# Patient Record
Sex: Female | Born: 1955 | Race: White | Hispanic: No | Marital: Single | Smoking: Current every day smoker
Health system: Southern US, Community
[De-identification: ages and names within clinical notes are randomized; demographics above are authoritative.]

## PROBLEM LIST (undated history)

## (undated) DIAGNOSIS — Z21 Asymptomatic human immunodeficiency virus [HIV] infection status: Secondary | ICD-10-CM

## (undated) DIAGNOSIS — B20 Human immunodeficiency virus [HIV] disease: Secondary | ICD-10-CM

## (undated) HISTORY — DX: Human immunodeficiency virus (HIV) disease: B20

---

## 1997-10-10 ENCOUNTER — Emergency Department (HOSPITAL_COMMUNITY): Admission: EM | Admit: 1997-10-10 | Discharge: 1997-10-10 | Payer: Self-pay | Admitting: Emergency Medicine

## 1998-06-06 ENCOUNTER — Emergency Department (HOSPITAL_COMMUNITY): Admission: EM | Admit: 1998-06-06 | Discharge: 1998-06-06 | Payer: Self-pay | Admitting: Emergency Medicine

## 1999-08-23 ENCOUNTER — Emergency Department (HOSPITAL_COMMUNITY): Admission: EM | Admit: 1999-08-23 | Discharge: 1999-08-23 | Payer: Self-pay | Admitting: Emergency Medicine

## 2003-09-30 ENCOUNTER — Emergency Department (HOSPITAL_COMMUNITY): Admission: EM | Admit: 2003-09-30 | Discharge: 2003-09-30 | Payer: Self-pay | Admitting: Emergency Medicine

## 2004-12-19 ENCOUNTER — Emergency Department (HOSPITAL_COMMUNITY): Admission: EM | Admit: 2004-12-19 | Discharge: 2004-12-19 | Payer: Self-pay | Admitting: Emergency Medicine

## 2005-04-23 IMAGING — CR DG WRIST COMPLETE 3+V*R*
1 series · 1 of 1 positions shown · non-contrast
Comparison: none

CLINICAL DATA: Recent fall with pain. 
 RIGHT WRIST (FOUR VIEWS):
 Four views of the right wrist were obtained.  
 No definite fracture is seen.  However, on the oblique view there is a linear lucency through the region of radial styloid extending into the radiocarpal joint space which could represent nondisplaced fracture.  There is some soft tissue swelling present.    The navicular appears intact.
 IMPRESSION
 Cannot exclude nondisplaced fracture of the right radial styloid seen only on one view.  Suggest follow-up films.

[view not recorded]
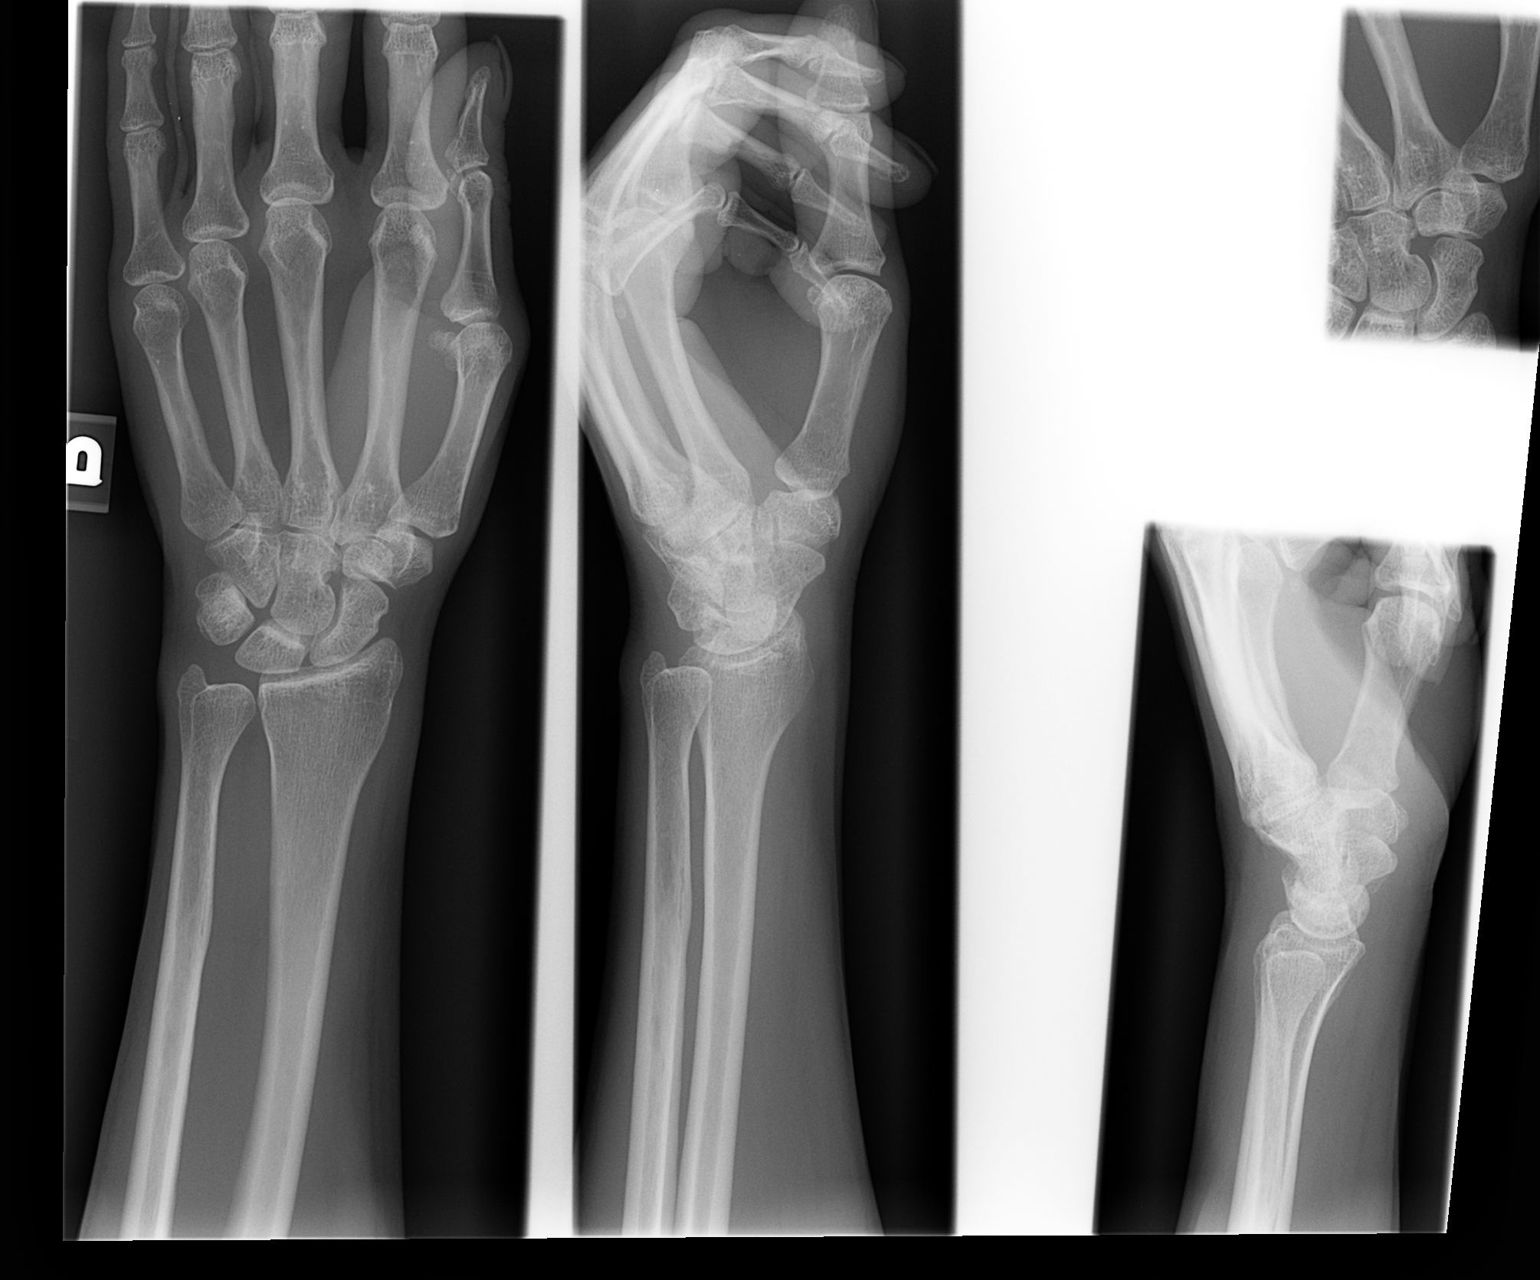

[1 of 1 positions shown; findings below may reference images not displayed]

## 2014-01-23 DIAGNOSIS — B20 Human immunodeficiency virus [HIV] disease: Secondary | ICD-10-CM | POA: Diagnosis present

## 2015-07-03 DIAGNOSIS — B0223 Postherpetic polyneuropathy: Secondary | ICD-10-CM | POA: Insufficient documentation

## 2019-12-14 ENCOUNTER — Encounter (HOSPITAL_COMMUNITY): Payer: Self-pay | Admitting: *Deleted

## 2019-12-14 ENCOUNTER — Other Ambulatory Visit: Payer: Self-pay

## 2019-12-14 ENCOUNTER — Emergency Department (HOSPITAL_COMMUNITY): Payer: Self-pay

## 2019-12-14 ENCOUNTER — Emergency Department (HOSPITAL_COMMUNITY)
Admission: EM | Admit: 2019-12-14 | Discharge: 2019-12-14 | Disposition: A | Discharge status: Home / Self Care | Payer: Self-pay | Attending: Emergency Medicine | Admitting: Emergency Medicine

## 2019-12-14 DIAGNOSIS — R531 Weakness: Secondary | ICD-10-CM | POA: Insufficient documentation

## 2019-12-14 DIAGNOSIS — R197 Diarrhea, unspecified: Secondary | ICD-10-CM | POA: Insufficient documentation

## 2019-12-14 DIAGNOSIS — R0602 Shortness of breath: Secondary | ICD-10-CM | POA: Insufficient documentation

## 2019-12-14 DIAGNOSIS — R0989 Other specified symptoms and signs involving the circulatory and respiratory systems: Secondary | ICD-10-CM | POA: Insufficient documentation

## 2019-12-14 DIAGNOSIS — R638 Other symptoms and signs concerning food and fluid intake: Secondary | ICD-10-CM | POA: Insufficient documentation

## 2019-12-14 DIAGNOSIS — F17200 Nicotine dependence, unspecified, uncomplicated: Secondary | ICD-10-CM | POA: Insufficient documentation

## 2019-12-14 HISTORY — DX: Asymptomatic human immunodeficiency virus (hiv) infection status: Z21

## 2019-12-14 LAB — CBC WITH DIFFERENTIAL/PLATELET
Abs Immature Granulocytes: 0.02 10*3/uL (ref 0.00–0.07)
Basophils Absolute: 0 10*3/uL (ref 0.0–0.1)
Basophils Relative: 0 %
Eosinophils Absolute: 0 10*3/uL (ref 0.0–0.5)
Eosinophils Relative: 0 %
HCT: 44.1 % (ref 36.0–46.0)
Hemoglobin: 14.7 g/dL (ref 12.0–15.0)
Immature Granulocytes: 0 %
Lymphocytes Relative: 29 %
Lymphs Abs: 1.3 10*3/uL (ref 0.7–4.0)
MCH: 30.8 pg (ref 26.0–34.0)
MCHC: 33.3 g/dL (ref 30.0–36.0)
MCV: 92.3 fL (ref 80.0–100.0)
Monocytes Absolute: 0.4 10*3/uL (ref 0.1–1.0)
Monocytes Relative: 8 %
Neutro Abs: 2.8 10*3/uL (ref 1.7–7.7)
Neutrophils Relative %: 63 %
Platelets: 153 10*3/uL (ref 150–400)
RBC: 4.78 MIL/uL (ref 3.87–5.11)
RDW: 13.4 % (ref 11.5–15.5)
WBC: 4.5 10*3/uL (ref 4.0–10.5)
nRBC: 0 % (ref 0.0–0.2)

## 2019-12-14 LAB — COMPREHENSIVE METABOLIC PANEL
ALT: 20 U/L (ref 0–44)
AST: 35 U/L (ref 15–41)
Albumin: 4.8 g/dL (ref 3.5–5.0)
Alkaline Phosphatase: 66 U/L (ref 38–126)
Anion gap: 12 (ref 5–15)
BUN: 7 mg/dL — ABNORMAL LOW (ref 8–23)
CO2: 26 mmol/L (ref 22–32)
Calcium: 9.6 mg/dL (ref 8.9–10.3)
Chloride: 100 mmol/L (ref 98–111)
Creatinine, Ser: 0.59 mg/dL (ref 0.44–1.00)
GFR calc Af Amer: >60 mL/min (ref >60)
GFR calc non Af Amer: >60 mL/min (ref >60)
Glucose, Bld: 106 mg/dL — ABNORMAL HIGH (ref 70–99)
Potassium: 3.7 mmol/L (ref 3.5–5.1)
Sodium: 138 mmol/L (ref 135–145)
Total Bilirubin: 0.9 mg/dL (ref 0.3–1.2)
Total Protein: 10.3 g/dL — ABNORMAL HIGH (ref 6.5–8.1)

## 2019-12-14 LAB — LIPASE, BLOOD: Lipase: 26 U/L (ref 11–51)

## 2019-12-14 MED ORDER — SODIUM CHLORIDE 0.9 % IV BOLUS
1000.0000 mL | Freq: Once | INTRAVENOUS | Status: AC
  Administered 2019-12-14: 1000 mL via INTRAVENOUS

## 2019-12-14 NOTE — ED Provider Notes (Addendum)
I saw and evaluated the patient, reviewed the resident's note and I agree with the findings and plan.  EKG: EKG Interpretation  Date/Time:  Wednesday December 14 2019 16:30:02 EDT Ventricular Rate:  65 PR Interval:    QRS Duration: 95 QT Interval:  419 QTC Calculation: 436 R Axis:   90 Text Interpretation: Sinus rhythm Borderline right axis deviation 12 Lead; Mason-Likar No old tracing to compare Confirmed by Lorre Nick ([id number]) on 12/14/2019 5:45:86 PM  64 year old female here with diarrhea after possible bad food exposure.  Has no abdominal pain no emesis.  Labs reassuring here will discharge   Lorre Nick, MD 12/14/19 1724    Lorre Nick, MD 12/14/19 1725

## 2019-12-14 NOTE — ED Provider Notes (Signed)
[facility]-EMERGENCY DEPT Provider Note   CSN: [id number] Arrival date & time: 12/14/19  1056     History Chief Complaint  Patient presents with  . Shortness of Breath  . Weakness    Renee Stark is a 64 y.o. female.  Patient is a 65 year old female with past medical history significant for well-controlled HIV presenting with 2 days of malaise, weakness, loose bowel movements after eating fast food on Monday evening.  Patient states Monday evening she had [employer] with her son and the next day noticed that she felt really weak, had a dry mouth, and had multiple soft nonwatery bowel movements.  Patient states she had about 4 bowel movements on Tuesday interim for today.  Patient denies nausea, vomiting, abdominal pain but does states she got short of breath today after having a bowel movement which lasted for a few minutes and then resolved.  Patient states she also had a second bout of shortness of breath when arriving in the emergency department which lasted 5 to 10 minutes.  She states that she thinks this may have been due to anxiety but she has never had this happen before.  Patient states that her son who ate fast food with her, though he had a different meal did not get ill and has no sick contacts.  She denies ever having history of blood clot, denies other medical conditions other than her HIV, and states she does not take any other medications besides those for her HIV.        Past Medical History:  Diagnosis Date  . HIV (human immunodeficiency virus infection) (HCC)     There are no problems to display for this patient.   History reviewed. No pertinent surgical history.   OB History   No obstetric history on file.     No family history on file.  Social History   Tobacco Use  . Smoking status: Current Every Day Smoker  . Smokeless tobacco: Never Used  Substance Use Topics  . Alcohol use: Not Currently  . Drug use: Not Currently     Home Medications Prior to Admission medications   Not on File    Allergies    Patient has no allergy information on record.  Review of Systems   Review of Systems  Constitutional: Positive for appetite change (reduced). Negative for chills and fever.  HENT: Negative for congestion.   Eyes: Negative for visual disturbance.  Respiratory: Positive for shortness of breath. Negative for chest tightness and wheezing.   Cardiovascular: Negative for chest pain and leg swelling.  Gastrointestinal: Positive for diarrhea. Negative for abdominal pain, blood in stool, nausea and vomiting.  Genitourinary: Negative for dysuria.  Skin: Negative for rash.  Neurological: Positive for weakness (generalized). Negative for numbness and headaches.    Physical Exam Updated Vital Signs BP 135/66   Pulse 61   Temp 98.2 F (36.8 C) (Oral)   Resp 19   Ht 5\' 3"  (1.6 m)   Wt 50.8 kg   SpO2 99%   BMI 19.84 kg/m   Physical Exam Vitals reviewed.  Constitutional:      General: She is not in acute distress.    Appearance: She is well-developed.  HENT:     Head: Normocephalic and atraumatic.     Mouth/Throat:     Pharynx: No pharyngeal swelling or oropharyngeal exudate.     Comments: dry Eyes:     Extraocular Movements: Extraocular movements intact.  Cardiovascular:  Rate and Rhythm: Normal rate and regular rhythm.     Heart sounds: No murmur heard.   Pulmonary:     Effort: Pulmonary effort is normal.     Breath sounds: Examination of the left-lower field reveals rales. Rales (faint) present. No wheezing or rhonchi.  Abdominal:     General: Bowel sounds are normal.     Palpations: Abdomen is soft. There is no mass.     Tenderness: There is no abdominal tenderness. There is no guarding.  Musculoskeletal:     Cervical back: Normal range of motion.     Right lower leg: No tenderness. No edema.     Left lower leg: No tenderness. No edema.  Skin:    General: Skin is warm and dry.   Neurological:     General: No focal deficit present.     Mental Status: She is alert.  Psychiatric:        Mood and Affect: Mood normal.     ED Results / Procedures / Treatments   Labs (all labs ordered are listed, but only abnormal results are displayed) Labs Reviewed  COMPREHENSIVE METABOLIC PANEL - Abnormal; Notable for the following components:      Result Value   Glucose, Bld 106 (*)    BUN 7 (*)    Total Protein 10.3 (*)    All other components within normal limits  CBC WITH DIFFERENTIAL/PLATELET  LIPASE, BLOOD    EKG EKG Interpretation  Date/Time:  Wednesday December 14 2019 16:30:02 EDT Ventricular Rate:  65 PR Interval:    QRS Duration: 95 QT Interval:  419 QTC Calculation: 436 R Axis:   90 Text Interpretation: Sinus rhythm Borderline right axis deviation 12 Lead; Mason-Likar No old tracing to compare Confirmed by Lorre Nick ([id number]) on 12/14/2019 5:24:42 PM   Radiology DG Chest 2 View  Result Date: 12/14/2019 CLINICAL DATA:  Shortness of breath EXAM: CHEST - 2 VIEW COMPARISON:  None. FINDINGS: The heart size and mediastinal contours are within normal limits. Aortic knob calcifications are seen. There appears to be a probable diaphragmatic hernia with loops bowel and likely the gastric bubble noted overlying the mediastinum. Subsegmental atelectasis likely seen at the left lung base. The right lung is clear. No acute osseous abnormality. IMPRESSION: No active cardiopulmonary disease. 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 Electronically Signed   By: Jonna Clark M.D.   On: 12/14/2019 17:28    Procedures Procedures (including critical care time)  Medications Ordered in ED Medications  sodium chloride 0.9 % bolus 1,000 mL (1,000 mLs Intravenous New Bag/Given 12/14/19 1646)    ED Course  I have reviewed the triage vital signs and the nursing notes.  Pertinent labs & imaging results that were available  during my care of the patient were reviewed by me and considered in my medical decision making (see chart for details).    MDM Rules/Calculators/A&P                          64 year old female with a history of well-controlled HIV presenting with malaise, loose stools, and 2 bouts of shortness of breath after consuming fast food 2 days ago.  Will check CBC, CMP, lipase, chest x-ray.  We will also provide normal saline bolus. CBC shows normal white blood cell count of 4.5, no anemia. CMP wnl other than increased protein level of 10.3. Chest x-ray with no active cardiopulmonary disease. EKG shows  sinus rhythm.  Patient states she feels better after receiving IV fluids.  Most likely cause of patient's diarrhea is from the food she consumed.  Patient with normal lipase and AST/ALT with no hyper/hypoglycemia.  CBC shows white blood cell count within normal limits making serious infection less likely, patient also has no symptoms of fevers, vomiting, or other concerning symptoms which supports her diarrhea being primarily from the fast food that she consumed.  We will plan for discharge with return precautions provided.  Final Clinical Impression(s) / ED Diagnoses Final diagnoses:  Diarrhea, unspecified type    Rx / DC Orders ED Discharge Orders    None       Jackelyn Poling, DO 12/14/19 1833    Lorre Nick, MD 12/15/19 1126

## 2019-12-14 NOTE — Discharge Instructions (Addendum)
You were evaluated in the emergency department due to two days of diarrhea after consuming some fast food and feeling ill.  In the emergency department we checked some blood work as well as a chest x-ray and an EKG.  None of these showed any concerning cause of your diarrhea.  We also provided you with some IV fluids which seemed to make you feel better.  It is important that you continue to drink plenty of fluids as your diarrhea may continue for another day or 2.  If you develop worsening diarrhea, fevers, shortness of breath, weakness, or other concerning symptoms please return to the emergency department or reach out to your primary care physician for further advisement.

## 2019-12-14 NOTE — ED Triage Notes (Signed)
Pt complains of weakness, difficulty breathing since yesterday. She also reports more frequent bowel movements, no diarrhea. She states she went to [employer] and has not felt well since.

## 2021-06-07 ENCOUNTER — Emergency Department (HOSPITAL_COMMUNITY)
Admission: EM | Admit: 2021-06-07 | Discharge: 2021-06-07 | Disposition: A | Discharge status: Home / Self Care | Payer: Self-pay | Attending: Emergency Medicine | Admitting: Emergency Medicine

## 2021-06-07 ENCOUNTER — Encounter (HOSPITAL_COMMUNITY): Payer: Self-pay

## 2021-06-07 ENCOUNTER — Emergency Department (HOSPITAL_COMMUNITY): Payer: Self-pay

## 2021-06-07 ENCOUNTER — Other Ambulatory Visit: Payer: Self-pay

## 2021-06-07 DIAGNOSIS — Z20822 Contact with and (suspected) exposure to covid-19: Secondary | ICD-10-CM | POA: Insufficient documentation

## 2021-06-07 DIAGNOSIS — R051 Acute cough: Secondary | ICD-10-CM | POA: Insufficient documentation

## 2021-06-07 DIAGNOSIS — Z21 Asymptomatic human immunodeficiency virus [HIV] infection status: Secondary | ICD-10-CM | POA: Insufficient documentation

## 2021-06-07 LAB — RESP PANEL BY RT-PCR (FLU A&B, COVID) ARPGX2
Influenza A by PCR: NEGATIVE
Influenza B by PCR: NEGATIVE
SARS Coronavirus 2 by RT PCR: NEGATIVE

## 2021-06-07 NOTE — ED Provider Triage Note (Signed)
Emergency Medicine Provider Triage Evaluation Note  Renee Stark , a 66 y.o. female  was evaluated in triage.  Pt complains of episode of SOB.  States has had a cough x3 days, worsening.  States tonight she coughed so hard she got SOB temporarily.  States she feels fine now but was concerned so son brought her in.  No hx of asthma, COPD, or other respiratory issues.  Denies chest pain.  Review of Systems  Positive: Cough, SOB (resolved now) Negative: fever  Physical Exam  BP (!) 165/91 (BP Location: Left Arm)    Pulse 90    Temp (!) 97.5 F (36.4 C) (Oral)    Resp 18    Ht 5\' 3"  (1.6 m)    Wt 47.6 kg    SpO2 100%    BMI 18.60 kg/m  Gen:   Awake, no distress   Resp:  Normal effort, speaking in full sentences without difficulty MSK:   Moves extremities without difficulty  Other:    Medical Decision Making  Medically screening exam initiated at 12:42 AM.  Appropriate orders placed.  Renee Stark was informed that the remainder of the evaluation will be completed by another provider, this initial triage assessment does not replace that evaluation, and the importance of remaining in the ED until their evaluation is complete.  Cough w/episode of SOB.  Back to baseline now, VSS.  Denies chest pain.  Will obtain CXR, covid/flu screen.   Alto Denver, PA-C 06/07/21 [phone]

## 2021-06-07 NOTE — ED Provider Notes (Signed)
[facility]-EMERGENCY DEPT Provider Note   CSN: [id number] Arrival date & time: 06/07/21  0017     History  Chief Complaint  Patient presents with   Cough    Renee Stark is a 66 y.o. female.  HPI    66 year old female with history of cough.  Cough into 3 days.  She is producing some phlegm, but unsure what color.  She denies any associated fevers, chills.  Yesterday night the cough was severe enough where she started feeling short of breath, and decided to come to the ER.  She denies any body aches, nausea, vomiting.   Records indicate that patient also has history of HIV.  She reports that it is well controlled.  Patient denies any primary cardiac, pulmonary disease history.  At the moment, she has no shortness of breath, the episode of shortness of breath lasted few minutes after her coughing spell.  Home Medications Prior to Admission medications   Not on File      Allergies    Patient has no known allergies.    Review of Systems   Review of Systems  Constitutional:  Positive for activity change.  Respiratory:  Positive for cough and shortness of breath.   Cardiovascular:  Negative for chest pain.  Gastrointestinal:  Negative for nausea and vomiting.  Allergic/Immunologic: Positive for immunocompromised state.   Physical Exam Updated Vital Signs BP (!) 162/93 (BP Location: Left Arm)    Pulse 89    Temp (!) 97.3 F (36.3 C) (Oral)    Resp 18    Ht 5\' 3"  (1.6 m)    Wt 47.6 kg    SpO2 95%    BMI 18.60 kg/m  Physical Exam Vitals and nursing note reviewed.  Constitutional:      Appearance: She is well-developed.  HENT:     Head: Atraumatic.  Cardiovascular:     Rate and Rhythm: Normal rate.  Pulmonary:     Effort: Pulmonary effort is normal.     Breath sounds: No wheezing.  Musculoskeletal:     Cervical back: Normal range of motion and neck supple.  Skin:    General: Skin is warm and dry.  Neurological:     Mental Status: She is alert  and oriented to person, place, and time.    ED Results / Procedures / Treatments   Labs (all labs ordered are listed, but only abnormal results are displayed) Labs Reviewed  RESP PANEL BY RT-PCR (FLU A&B, COVID) [id number]    EKG None  Radiology DG Chest 2 View  Result Date: 06/07/2021 CLINICAL DATA:  Cough and shortness of breath. EXAM: CHEST - 2 VIEW COMPARISON:  Radiograph 12/14/2019 FINDINGS: Large hiatal/diaphragmatic hernia likely containing a small and/or large bowel as well as stomach. This obscures assessment of the heart size which appears normal on the lateral view. Adjacent atelectasis or scarring at the left lung base. No acute consolidation. No pleural effusion, pneumothorax, or pulmonary edema. No acute osseous abnormalities are seen. IMPRESSION: 1. No acute findings. 2. 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 Adjacent atelectasis or scarring at the left lung base. Findings are unchanged from prior exam. Electronically Signed   By: 12/16/2019 M.D.   On: 06/07/2021 00:58    Procedures Procedures    Medications Ordered in ED Medications - No data to display  ED Course/ Medical Decision Making/ A&P  Medical Decision Making  66 year old woman comes in with chief complaint of cough.  She had a coughing spell that led to shortness of breath, and prompted her to come to the ER this morning.  Coughing for 2 or 3 days, productive, but with clear lung exams and normal x-ray.  She also denies any fevers, chills.  History of HIV, but it is well controlled.  I have reviewed records from [facility] from 2021 that indicated the same.  At this time, advised patient to treat this like a viral syndrome. If her symptoms continue then she will need to see her PCP.  If her symptoms get worse, then she will have to come to the ER.  Specifically if she starts having fevers, shortness of breath she will  return to the ER  COVID-19, flu swab is negative. Chest x-ray was independently reviewed by me and does not show any acute findings.        Final Clinical Impression(s) / ED Diagnoses Final diagnoses:  Acute cough    Rx / DC Orders ED Discharge Orders     None         Derwood Kaplan, MD 06/07/21 1101

## 2021-06-07 NOTE — ED Triage Notes (Signed)
Pt reports productive cough x 3 days.  

## 2021-06-07 NOTE — Discharge Instructions (Signed)
Take OTC cough meds, hard candy, honey and hot tea for symptoms relief.

## 2021-07-07 IMAGING — CR DG CHEST 2V
2 series · 2 of 2 positions shown · non-contrast
Comparison: None.

CLINICAL DATA: Shortness of breath

EXAM:
CHEST - 2 VIEW

[w chest pa]
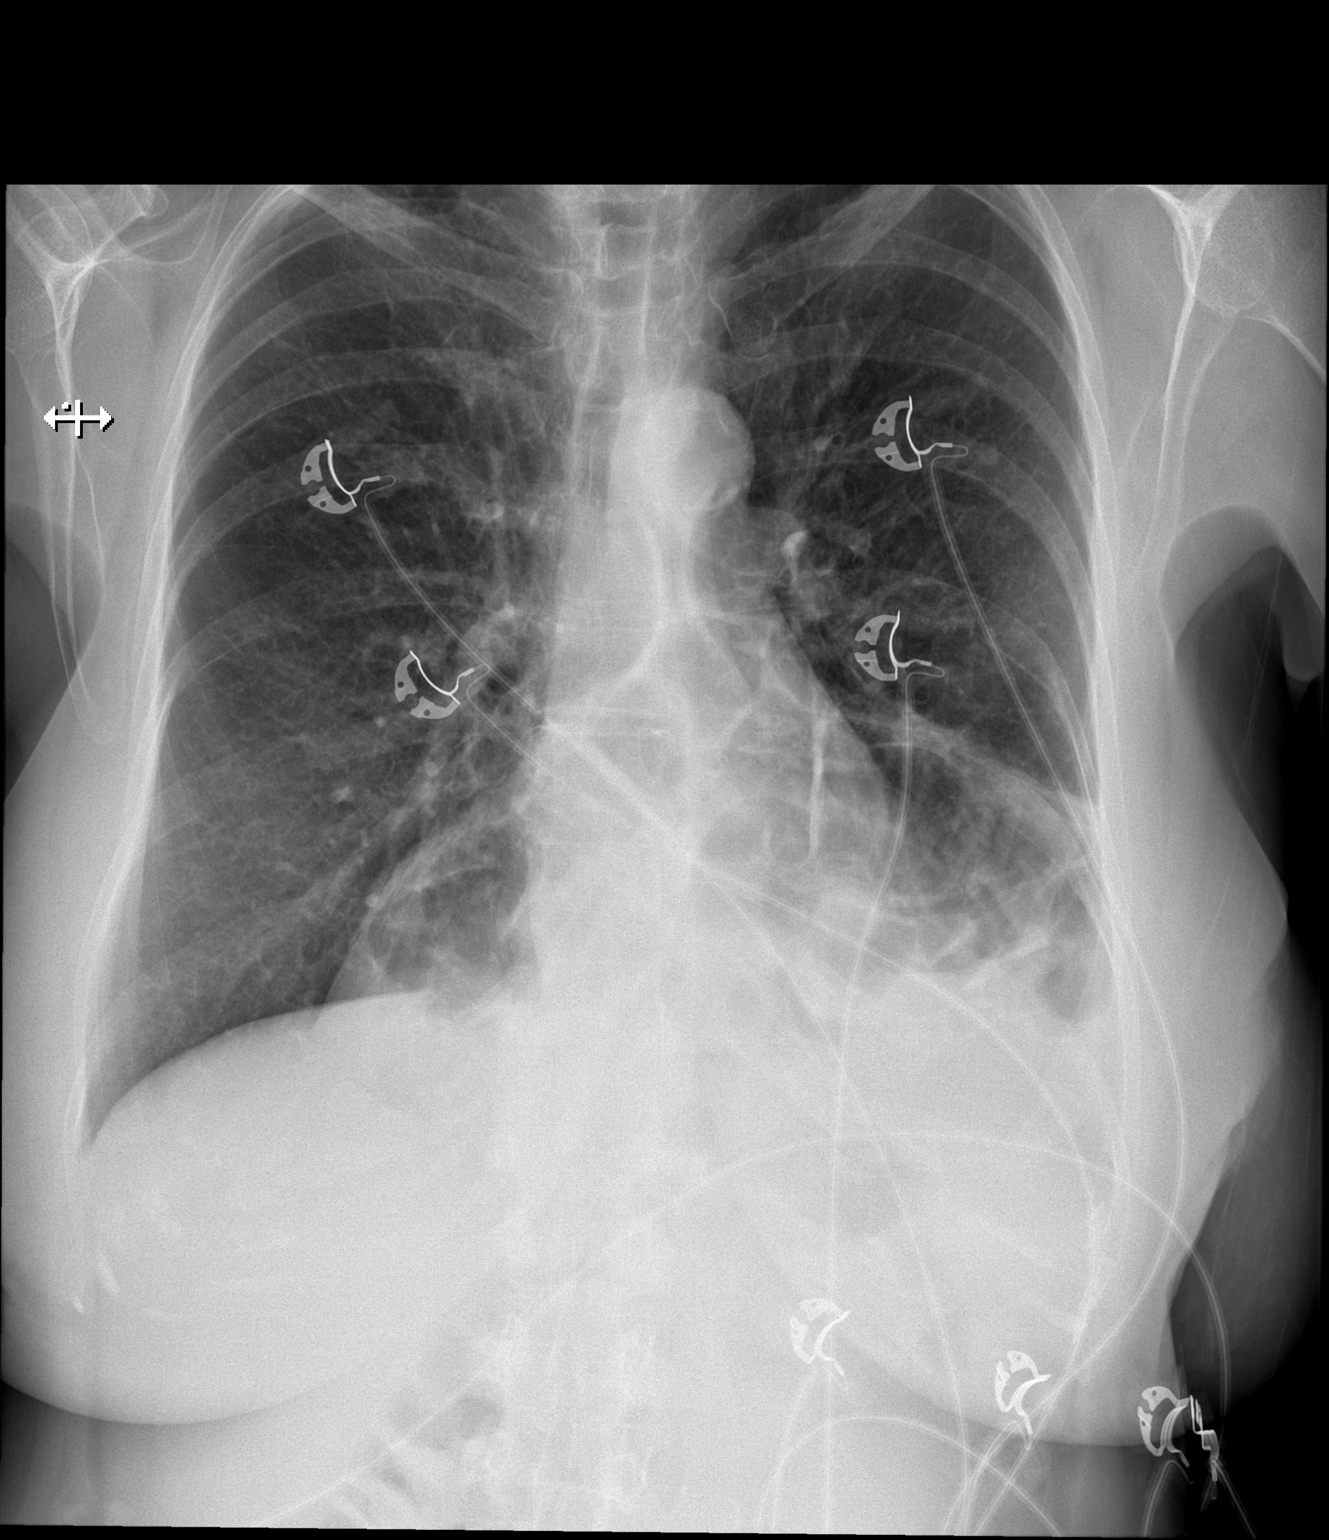

[w chest lat]
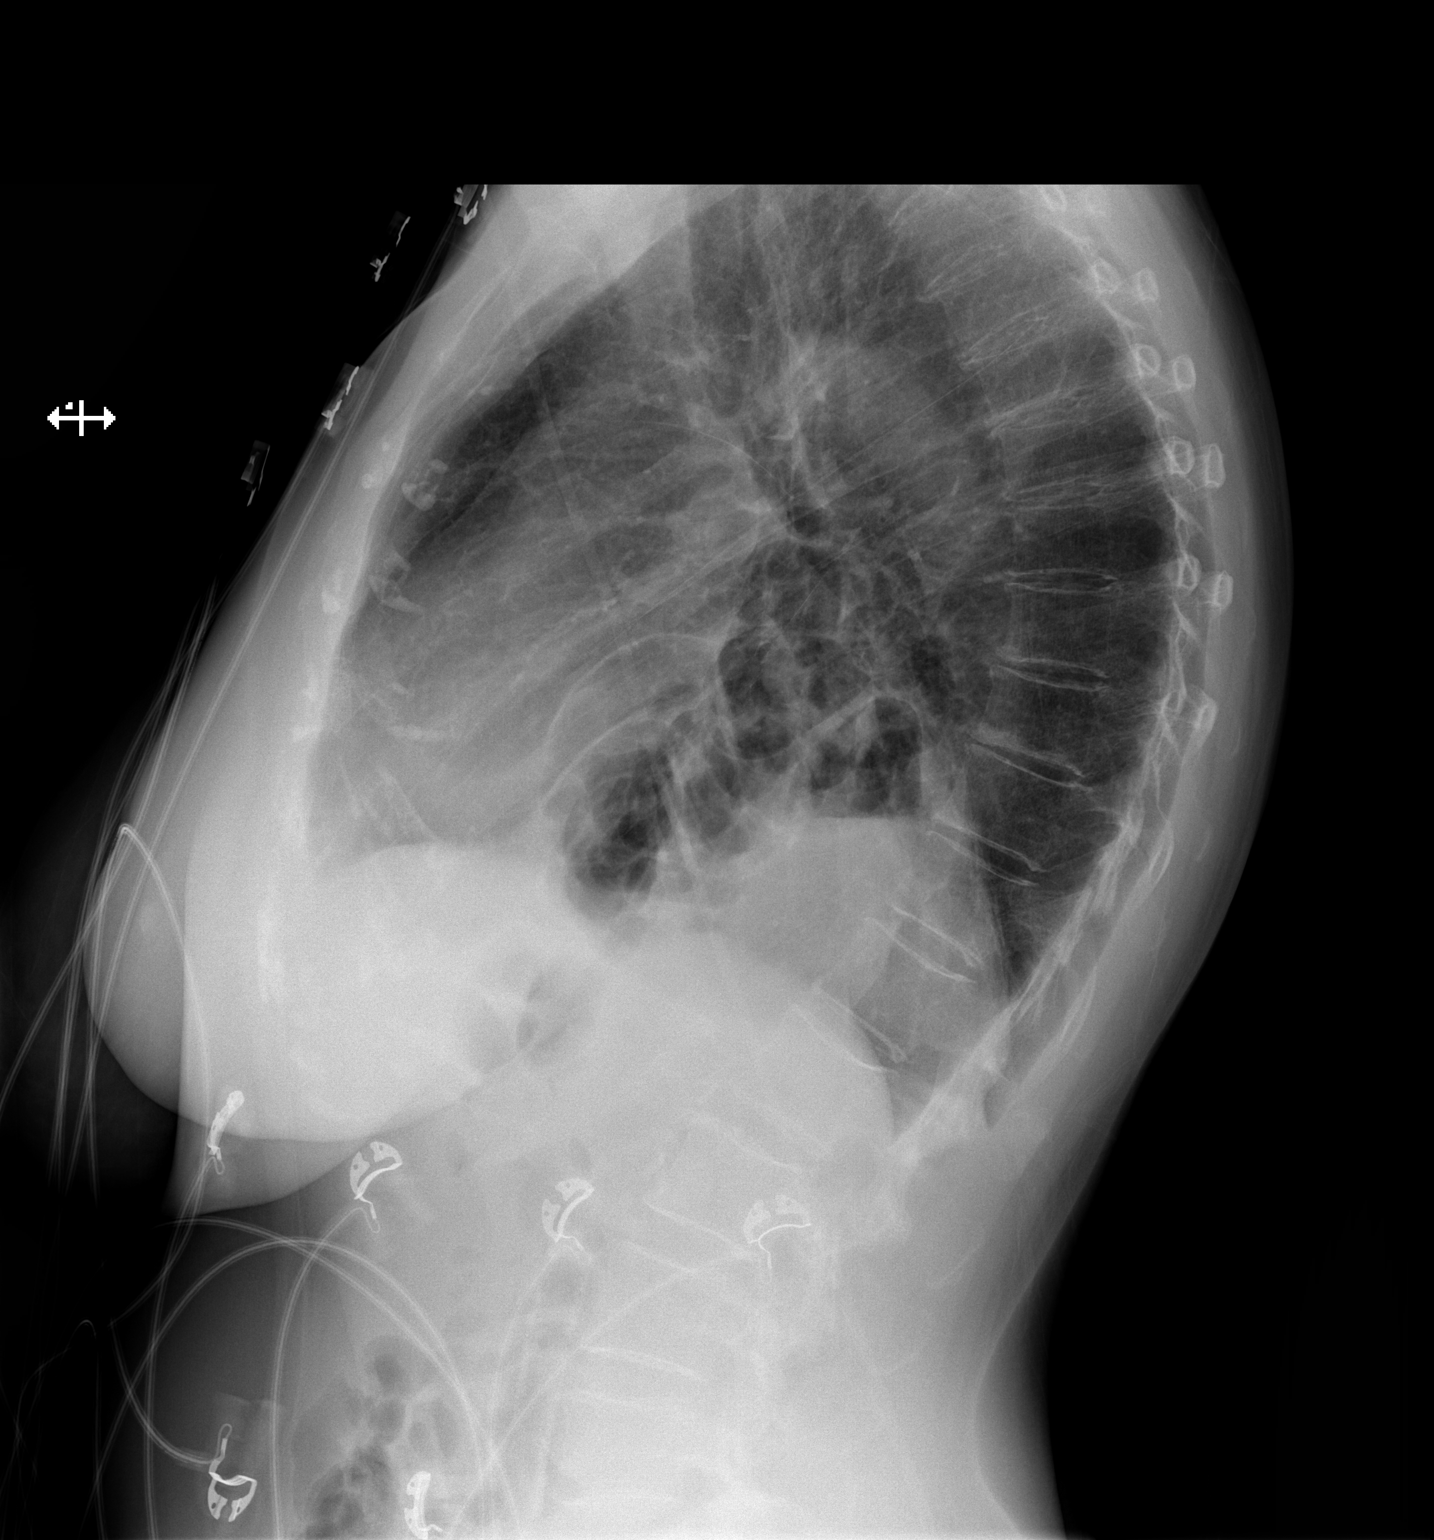

[2 of 2 positions shown; findings below may reference images not displayed]

FINDINGS: The heart size and mediastinal contours are within normal limits.
Aortic knob calcifications are seen. There appears to be a probable
diaphragmatic hernia with loops bowel and likely the gastric bubble
noted overlying the mediastinum. Subsegmental atelectasis likely
seen at the left lung base. The right lung is clear. No acute
osseous abnormality.
IMPRESSION: No active cardiopulmonary disease. Probable diaphragmatic hernia
with loops of bowel and stomach seen overlying the mediastinum.

## 2023-09-08 ENCOUNTER — Encounter (HOSPITAL_COMMUNITY): Payer: Self-pay | Admitting: Emergency Medicine

## 2023-09-08 ENCOUNTER — Emergency Department (HOSPITAL_COMMUNITY): Payer: Self-pay

## 2023-09-08 ENCOUNTER — Emergency Department (HOSPITAL_COMMUNITY)
Admission: EM | Admit: 2023-09-08 | Discharge: 2023-09-08 | Disposition: A | Discharge status: Home / Self Care | Payer: Self-pay | Attending: Emergency Medicine | Admitting: Emergency Medicine

## 2023-09-08 DIAGNOSIS — E876 Hypokalemia: Secondary | ICD-10-CM | POA: Insufficient documentation

## 2023-09-08 DIAGNOSIS — J189 Pneumonia, unspecified organism: Secondary | ICD-10-CM

## 2023-09-08 DIAGNOSIS — B370 Candidal stomatitis: Secondary | ICD-10-CM | POA: Insufficient documentation

## 2023-09-08 DIAGNOSIS — R748 Abnormal levels of other serum enzymes: Secondary | ICD-10-CM | POA: Insufficient documentation

## 2023-09-08 DIAGNOSIS — J181 Lobar pneumonia, unspecified organism: Secondary | ICD-10-CM | POA: Insufficient documentation

## 2023-09-08 DIAGNOSIS — Z21 Asymptomatic human immunodeficiency virus [HIV] infection status: Secondary | ICD-10-CM | POA: Insufficient documentation

## 2023-09-08 LAB — CBC WITH DIFFERENTIAL/PLATELET
Abs Immature Granulocytes: 1.06 10*3/uL — ABNORMAL HIGH (ref 0.00–0.07)
Basophils Absolute: 0.1 10*3/uL (ref 0.0–0.1)
Basophils Relative: 1 %
Eosinophils Absolute: 0 10*3/uL (ref 0.0–0.5)
Eosinophils Relative: 0 %
HCT: 35.7 % — ABNORMAL LOW (ref 36.0–46.0)
Hemoglobin: 11.5 g/dL — ABNORMAL LOW (ref 12.0–15.0)
Immature Granulocytes: 15 %
Lymphocytes Relative: 12 %
Lymphs Abs: 0.9 10*3/uL (ref 0.7–4.0)
MCH: 28 pg (ref 26.0–34.0)
MCHC: 32.2 g/dL (ref 30.0–36.0)
MCV: 86.9 fL (ref 80.0–100.0)
Monocytes Absolute: 0.5 10*3/uL (ref 0.1–1.0)
Monocytes Relative: 7 %
Neutro Abs: 4.4 10*3/uL (ref 1.7–7.7)
Neutrophils Relative %: 65 %
Platelets: 237 10*3/uL (ref 150–400)
RBC: 4.11 MIL/uL (ref 3.87–5.11)
RDW: 14.2 % (ref 11.5–15.5)
Smear Review: NORMAL
WBC: 6.9 10*3/uL (ref 4.0–10.5)
nRBC: 0 % (ref 0.0–0.2)

## 2023-09-08 LAB — COMPREHENSIVE METABOLIC PANEL WITH GFR
ALT: 14 U/L (ref 0–44)
AST: 33 U/L (ref 15–41)
Albumin: 2.8 g/dL — ABNORMAL LOW (ref 3.5–5.0)
Alkaline Phosphatase: 837 U/L — ABNORMAL HIGH (ref 38–126)
Anion gap: 12 (ref 5–15)
BUN: 12 mg/dL (ref 8–23)
CO2: 23 mmol/L (ref 22–32)
Calcium: 8.7 mg/dL — ABNORMAL LOW (ref 8.9–10.3)
Chloride: 92 mmol/L — ABNORMAL LOW (ref 98–111)
Creatinine, Ser: 0.5 mg/dL (ref 0.44–1.00)
GFR, Estimated: >60 mL/min (ref >60)
Glucose, Bld: 137 mg/dL — ABNORMAL HIGH (ref 70–99)
Potassium: 3 mmol/L — ABNORMAL LOW (ref 3.5–5.1)
Sodium: 127 mmol/L — ABNORMAL LOW (ref 135–145)
Total Bilirubin: 0.7 mg/dL (ref 0.0–1.2)
Total Protein: 8.3 g/dL — ABNORMAL HIGH (ref 6.5–8.1)

## 2023-09-08 LAB — MAGNESIUM: Magnesium: 2.1 mg/dL (ref 1.7–2.4)

## 2023-09-08 MED ORDER — AMOXICILLIN-POT CLAVULANATE 875-125 MG PO TABS: 1.0000 | ORAL_TABLET | Freq: Two times a day (BID) | ORAL | 0 refills | Status: DC

## 2023-09-08 MED ORDER — ONDANSETRON HCL 4 MG/2ML IJ SOLN
4.0000 mg | Freq: Once | INTRAMUSCULAR | Status: DC
  Filled 2023-09-08: qty 2

## 2023-09-08 MED ORDER — FLUCONAZOLE 200 MG PO TABS: 200.0000 mg | ORAL_TABLET | Freq: Every day | ORAL | 0 refills | Status: AC

## 2023-09-08 MED ORDER — SODIUM CHLORIDE 0.9 % IV SOLN
1.0000 g | Freq: Once | INTRAVENOUS | Status: AC
  Administered 2023-09-08: 1 g via INTRAVENOUS
  Filled 2023-09-08: qty 10

## 2023-09-08 MED ORDER — AZITHROMYCIN 250 MG PO TABS: 250.0000 mg | ORAL_TABLET | Freq: Every day | ORAL | 0 refills | Status: DC

## 2023-09-08 MED ORDER — FLUCONAZOLE 200 MG PO TABS
200.0000 mg | ORAL_TABLET | Freq: Once | ORAL | Status: AC
  Administered 2023-09-08: 200 mg via ORAL
  Filled 2023-09-08: qty 1

## 2023-09-08 MED ORDER — POTASSIUM CHLORIDE CRYS ER 20 MEQ PO TBCR
40.0000 meq | EXTENDED_RELEASE_TABLET | Freq: Once | ORAL | Status: AC
Start: 2023-09-08 — End: 2023-09-08
  Administered 2023-09-08: 40 meq via ORAL
  Filled 2023-09-08: qty 2

## 2023-09-08 MED ORDER — SULFAMETHOXAZOLE-TRIMETHOPRIM 800-160 MG PO TABS
1.0000 | ORAL_TABLET | Freq: Once | ORAL | Status: AC
  Administered 2023-09-08: 1 via ORAL
  Filled 2023-09-08: qty 1

## 2023-09-08 MED ORDER — AZITHROMYCIN 250 MG PO TABS
500.0000 mg | ORAL_TABLET | Freq: Once | ORAL | Status: AC
  Administered 2023-09-08: 500 mg via ORAL
  Filled 2023-09-08: qty 2

## 2023-09-08 MED ORDER — LIDOCAINE VISCOUS HCL 2 % MT SOLN
15.0000 mL | Freq: Once | OROMUCOSAL | Status: AC
  Administered 2023-09-08: 15 mL via OROMUCOSAL
  Filled 2023-09-08: qty 15

## 2023-09-08 NOTE — ED Provider Triage Note (Signed)
 Emergency Medicine Provider Triage Evaluation Note  Renee Stark , a 68 y.o. female  was evaluated in triage.  CC tongue pain. Pt complains of painful swallowing, white mouth and tongue and "can't eat". 2-3 weeks. Has been losing weight. Has not seen a Doctor in 2-3 years and thus has not had HIV medications for 2-3 years. Has been feeling "sick for a month." Thinks she has thrush.   Review of Systems  Positive: Cough Negative: HA, chest pain, shortness of breath, NVD  Physical Exam  BP 119/65 (BP Location: Left Arm)   Pulse 92   Temp 98.4 F (36.9 C) (Oral)   Resp 16   Ht 5\' 3"  (1.6 m)   Wt 39.2 kg   SpO2 99%   BMI 15.31 kg/m  Gen:   Awake, no distress   Resp:  Normal effort  MSK:   Moves extremities without difficulty  Other:    Medical Decision Making  Medically screening exam initiated at 3:26 PM.  Appropriate orders placed.  Renee Stark was informed that the remainder of the evaluation will be completed by another provider, this initial triage assessment does not replace that evaluation, and the importance of remaining in the ED until their evaluation is complete.     Smitty Knudsen, PA-C 09/08/23 1529

## 2023-09-08 NOTE — Discharge Instructions (Addendum)
 Please call Dr Elinor Parkinson with infectious disease for follow up.  Take fluconazole for thrush. Take Augmentin and azithromycin for pneumonia. If you have any new or worsening symptoms please return to emergency room.

## 2023-09-08 NOTE — ED Notes (Signed)
 Pt ambulated hallway with this RN while On pulse oximetry. O2 sats stayed between 99-100% and HR between 105-110. Pt denied SOB.

## 2023-09-08 NOTE — ED Provider Notes (Signed)
 [facility] EMERGENCY DEPARTMENT AT [facility] Provider Note   CSN: [id number] Arrival date & time: 09/08/23  1236     History  Chief Complaint  Patient presents with   Oral Pain    Renee Stark is a 68 y.o. female with past medical history of HIV presenting to emergency room with oral pain.  Patient reports symptoms been ongoing for about 2 weeks and associated with difficulty eating and painful swallowing.  Patient reports that she can't drink and can't eat and thinks she has thrush.  Denies any fevers or chills.  Notes recent weight loss of about 20 pounds.  Has not taken any antiviral medication for 2 to 3 days and has not seen a doctor in approximately 2 years.  She denies any other associated symptoms.   Oral Pain       Home Medications Prior to Admission medications   Not on File      Allergies    Patient has no known allergies.    Review of Systems   Review of Systems  HENT:  Positive for sore throat.     Physical Exam Updated Vital Signs BP 119/65 (BP Location: Left Arm)   Pulse 92   Temp 98.4 F (36.9 C) (Oral)   Resp 16   Ht 5\' 3"  (1.6 m)   Wt 39.2 kg   SpO2 99%   BMI 15.31 kg/m  Physical Exam Vitals and nursing note reviewed.  Constitutional:      General: She is not in acute distress.    Appearance: She is not toxic-appearing.  HENT:     Head: Normocephalic and atraumatic.     Mouth/Throat:     Mouth: Oral lesions present.     Comments: Mucosal lesions consistent with candidiasis.  No obvious posterior pharynx lesions Eyes:     General: No scleral icterus.    Conjunctiva/sclera: Conjunctivae normal.  Cardiovascular:     Rate and Rhythm: Normal rate and regular rhythm.     Pulses: Normal pulses.     Heart sounds: Normal heart sounds.  Pulmonary:     Effort: Pulmonary effort is normal. No respiratory distress.     Breath sounds: Normal breath sounds.  Abdominal:     General: Abdomen is flat. Bowel sounds are normal.      Palpations: Abdomen is soft.     Tenderness: There is no abdominal tenderness.  Skin:    General: Skin is warm and dry.     Findings: No lesion.  Neurological:     General: No focal deficit present.     Mental Status: She is alert and oriented to person, place, and time. Mental status is at baseline.     ED Results / Procedures / Treatments   Labs (all labs ordered are listed, but only abnormal results are displayed) Labs Reviewed  CBC WITH DIFFERENTIAL/PLATELET - Abnormal; Notable for the following components:      Result Value   Hemoglobin 11.5 (*)    HCT 35.7 (*)    Abs Immature Granulocytes 1.06 (*)    All other components within normal limits  COMPREHENSIVE METABOLIC PANEL WITH GFR - Abnormal; Notable for the following components:   Sodium 127 (*)    Potassium 3.0 (*)    Chloride 92 (*)    Glucose, Bld 137 (*)    Calcium 8.7 (*)    Total Protein 8.3 (*)    Albumin 2.8 (*)    Alkaline Phosphatase 837 (*)  All other components within normal limits  T-HELPER CELLS (CD4) COUNT (NOT AT [facility])    EKG EKG Interpretation Date/Time:  Tuesday September 08 2023 18:43:17 EDT Ventricular Rate:  94 PR Interval:  149 QRS Duration:  86 QT Interval:  344 QTC Calculation: 431 R Axis:   86  Text Interpretation: Sinus rhythm Borderline right axis deviation Probable left ventricular hypertrophy Confirmed by Ernie Avena ([id number]) on 09/08/2023 7:02:07 PM  Radiology DG Chest 2 View Result Date: 09/08/2023 CLINICAL DATA:  Cough EXAM: CHEST - 2 VIEW COMPARISON:  06/07/2021 FINDINGS: Large hiatal or diaphragmatic hernia as seen on the prior exam. Focal airspace disease in the right upper lobe and heterogeneous airspace disease at the right base. No pleural effusion. Normal cardiac size with aortic atherosclerosis. No pneumothorax IMPRESSION: 1. Focal airspace disease in the right upper lobe and heterogeneous airspace disease at the right base suspicious for pneumonia. Radiographic follow-up to  resolution is recommended. 2. Large hiatal or diaphragmatic hernia. Electronically Signed   By: Jasmine Pang M.D.   On: 09/08/2023 20:38    Procedures Procedures    Medications Ordered in ED Medications - No data to display  ED Course/ Medical Decision Making/ A&P Clinical Course as of 09/08/23 2153  Tue Sep 08, 2023  4098 Discussed case with Dr Odette Fraction with infectious disease. Felt patient was appropriate to be treated for oral candidiasis, follow up OP to restart ART therapy.   [JB]  2043 Patient is not febrile or hypoxic. She dose NOT want to stay in the hospital. She reports she will pick up her medication and understands importance of OP ID f/u and recheck x-ray for clearing... [JB]  2102 Spoke with pharmacy about PNA treatment, they reviewed chart and imaging feel Augmentin & Azithromycin is appropriate. Feel Bactrim is not needed for PNA coverage at this time.  [JB]  2106 Ambulates without hypoxia, no increased RR [JB]    Clinical Course User Index [JB] Jayliah Benett, Horald Chestnut, PA-C                                 Medical Decision Making Amount and/or Complexity of Data Reviewed Labs: ordered. Radiology: ordered.  Risk Prescription drug management.   This patient presents to the ED for concern of cough, this involves an extensive number of treatment options, and is a complaint that carries with it a high risk of complications and morbidity.  The differential diagnosis includes PNA, URI, PCP PNA, PE, pneumothorax, HIV/AIDS, mass   Co morbidities that complicate the patient evaluation  HIV, not taking antivirals     Lab Tests:  I personally interpreted labs.  The pertinent results include:   CBC without leukocytosis no significant anemia.  CMP potassium 3, Alk phos 837. Discussed with patient. Mag 2.1 CD4 count pending.    Imaging Studies ordered:  I ordered imaging studies including chest x-ray  I independently visualized and interpreted imaging which  showed RUL pneumonia  I agree with the radiologist interpretation   Cardiac Monitoring: / EKG:  The patient was maintained on a cardiac monitor.  Consultations Obtained:  I requested consultation with the ID,  and discussed lab and imaging findings as well as pertinent plan - they recommend: ART OP with ID, treating infections.    Problem List / ED Course / Critical interventions / Medication management  Patient came to emergency room with complaint of oral pain.  On exam she has  thrush.  Will give first dose of fluconazole here.  Did consult infectious disease in regards to patient.  Will obtain chest x-ray to evaluate for pneumonia and labs. Patient stable throughout stay, no distress. She dose not want to stay and has reassuring vitals with no hypoxia. Did consider admission due to immunocompromised state and lack of follow up history, but ID did not feel this is medically necessary at this time and patient was very adamant that she does not want to stay.  Patient was given first dose of Rocephin, azithromycin and Fluconazole in ER.  Was given viscous lidocaine for pain control.  And potassium to replete potassium I have reviewed the patients home medicines and have made adjustments as needed   Plan  F/u w/ PCP in 2-3d to ensure resolution of sx. Follow up with ID.  Patient was given return precautions. Patient stable for discharge at this time.  Patient educated on sx/dx and verbalized understanding of plan. Return to ER w/ new or worsening sx.          Final Clinical Impression(s) / ED Diagnoses Final diagnoses:  Oral candidiasis  Pneumonia of right upper lobe due to infectious organism  HIV infection, unspecified symptom status (HCC)    Rx / DC Orders ED Discharge Orders          Ordered    fluconazole (DIFLUCAN) 200 MG tablet  Daily        09/08/23 2104    amoxicillin-clavulanate (AUGMENTIN) 875-125 MG tablet  Every 12 hours        09/08/23 2104    azithromycin  (ZITHROMAX) 250 MG tablet  Daily        09/08/23 2104              Quinnlyn Hearns, Horald Chestnut, PA-C 09/08/23 2159    Lorre Nick, MD 09/10/23 1557

## 2023-09-08 NOTE — ED Triage Notes (Signed)
 Patient comes in oral pain for weeks. Thinks she has thrush. She doesn't see a doctor on a regular bases and hasn't seen a provider in years.  Unable to eat because her whole mouth hurts.

## 2023-09-09 ENCOUNTER — Telehealth: Payer: Self-pay

## 2023-09-09 LAB — T-HELPER CELLS (CD4) COUNT (NOT AT ARMC)
CD4 % Helper T Cell: 16 % — ABNORMAL LOW (ref 33–65)
CD4 T Cell Abs: 132 /uL — ABNORMAL LOW (ref 400–1790)

## 2023-09-09 NOTE — Telephone Encounter (Signed)
-----   Message from Victoriano Lain sent at 09/08/2023  6:44 PM EDT ----- Regarding: Known HIV, out of care Team,  Please arrange new patient appt at the next available date, 30 mins slot for HIV, return to care. Seen in the ED for possible thrush and ? PNA.    Thanks.

## 2023-09-09 NOTE — Telephone Encounter (Signed)
 Attempted to call patient to schedule new patient appointment for HIV care. Not able to reach her at this time. Left voicemail requesting call back.  Juanita Laster, RMA

## 2023-09-16 ENCOUNTER — Telehealth: Payer: Self-pay

## 2023-09-16 NOTE — Telephone Encounter (Signed)
 Left patient a voice mail to call back to schedule a new patient appointment. Was recently seen in the hospital and needs to schedule a New B20 appointment.

## 2023-09-16 NOTE — Telephone Encounter (Signed)
 Mailed letter requesting pt schedule appt.  Julien Odor, RMA

## 2023-11-17 ENCOUNTER — Emergency Department (HOSPITAL_COMMUNITY): Payer: MEDICAID

## 2023-11-17 ENCOUNTER — Emergency Department (HOSPITAL_COMMUNITY): Payer: Self-pay

## 2023-11-17 ENCOUNTER — Encounter (HOSPITAL_COMMUNITY): Payer: Self-pay

## 2023-11-17 ENCOUNTER — Other Ambulatory Visit: Payer: Self-pay

## 2023-11-17 ENCOUNTER — Inpatient Hospital Stay (HOSPITAL_COMMUNITY)
Admission: EM | Admit: 2023-11-17 | Discharge: 2023-11-21 | DRG: 974 | Disposition: A | Discharge status: Home / Self Care | Payer: Self-pay | Attending: Internal Medicine | Admitting: Internal Medicine

## 2023-11-17 DIAGNOSIS — J189 Pneumonia, unspecified organism: Principal | ICD-10-CM | POA: Diagnosis present

## 2023-11-17 DIAGNOSIS — Z681 Body mass index (BMI) 19 or less, adult: Secondary | ICD-10-CM | POA: Diagnosis not present

## 2023-11-17 DIAGNOSIS — Z91A48 Caregiver's other noncompliance with patient's medication regimen for other reason: Secondary | ICD-10-CM | POA: Diagnosis not present

## 2023-11-17 DIAGNOSIS — E43 Unspecified severe protein-calorie malnutrition: Secondary | ICD-10-CM | POA: Diagnosis present

## 2023-11-17 DIAGNOSIS — E876 Hypokalemia: Secondary | ICD-10-CM | POA: Diagnosis present

## 2023-11-17 DIAGNOSIS — E785 Hyperlipidemia, unspecified: Secondary | ICD-10-CM | POA: Diagnosis present

## 2023-11-17 DIAGNOSIS — T375X6A Underdosing of antiviral drugs, initial encounter: Secondary | ICD-10-CM | POA: Diagnosis present

## 2023-11-17 DIAGNOSIS — B20 Human immunodeficiency virus [HIV] disease: Secondary | ICD-10-CM | POA: Diagnosis present

## 2023-11-17 DIAGNOSIS — F17210 Nicotine dependence, cigarettes, uncomplicated: Secondary | ICD-10-CM | POA: Diagnosis present

## 2023-11-17 DIAGNOSIS — R0602 Shortness of breath: Secondary | ICD-10-CM | POA: Diagnosis present

## 2023-11-17 DIAGNOSIS — Z1152 Encounter for screening for COVID-19: Secondary | ICD-10-CM

## 2023-11-17 DIAGNOSIS — E871 Hypo-osmolality and hyponatremia: Secondary | ICD-10-CM | POA: Diagnosis present

## 2023-11-17 LAB — PROTIME-INR
INR: 1.2 (ref 0.8–1.2)
Prothrombin Time: 15.1 s (ref 11.4–15.2)

## 2023-11-17 LAB — BASIC METABOLIC PANEL WITH GFR
Anion gap: 12 (ref 5–15)
BUN: 15 mg/dL (ref 8–23)
CO2: 26 mmol/L (ref 22–32)
Calcium: 8.8 mg/dL — ABNORMAL LOW (ref 8.9–10.3)
Chloride: 94 mmol/L — ABNORMAL LOW (ref 98–111)
Creatinine, Ser: 0.55 mg/dL (ref 0.44–1.00)
GFR, Estimated: >60 mL/min (ref >60)
Glucose, Bld: 125 mg/dL — ABNORMAL HIGH (ref 70–99)
Potassium: 2.9 mmol/L — ABNORMAL LOW (ref 3.5–5.1)
Sodium: 132 mmol/L — ABNORMAL LOW (ref 135–145)

## 2023-11-17 LAB — D-DIMER, QUANTITATIVE: D-Dimer, Quant: 1.92 ug{FEU}/mL — ABNORMAL HIGH (ref 0.00–0.50)

## 2023-11-17 LAB — RESP PANEL BY RT-PCR (RSV, FLU A&B, COVID)  RVPGX2
Influenza A by PCR: NEGATIVE
Influenza B by PCR: NEGATIVE
Resp Syncytial Virus by PCR: NEGATIVE
SARS Coronavirus 2 by RT PCR: NEGATIVE

## 2023-11-17 LAB — CBC
HCT: 32 % — ABNORMAL LOW (ref 36.0–46.0)
Hemoglobin: 10.4 g/dL — ABNORMAL LOW (ref 12.0–15.0)
MCH: 28.3 pg (ref 26.0–34.0)
MCHC: 32.5 g/dL (ref 30.0–36.0)
MCV: 87 fL (ref 80.0–100.0)
Platelets: 204 10*3/uL (ref 150–400)
RBC: 3.68 MIL/uL — ABNORMAL LOW (ref 3.87–5.11)
RDW: 15.7 % — ABNORMAL HIGH (ref 11.5–15.5)
WBC: 5 10*3/uL (ref 4.0–10.5)
nRBC: 0 % (ref 0.0–0.2)

## 2023-11-17 LAB — LACTIC ACID, PLASMA
Lactic Acid, Venous: 1.8 mmol/L (ref 0.5–1.9)
Lactic Acid, Venous: 1.2 mmol/L (ref 0.5–1.9)

## 2023-11-17 LAB — T-HELPER CELLS (CD4) COUNT (NOT AT ARMC)
CD4 % Helper T Cell: 14 % — ABNORMAL LOW (ref 33–65)
CD4 T Cell Abs: 49 /uL — ABNORMAL LOW (ref 400–1790)

## 2023-11-17 LAB — MAGNESIUM: Magnesium: 1.9 mg/dL (ref 1.7–2.4)

## 2023-11-17 MED ORDER — LACTATED RINGERS IV BOLUS (SEPSIS)
1000.0000 mL | Freq: Once | INTRAVENOUS | Status: AC
  Administered 2023-11-17: 1000 mL via INTRAVENOUS

## 2023-11-17 MED ORDER — IPRATROPIUM-ALBUTEROL 0.5-2.5 (3) MG/3ML IN SOLN
3.0000 mL | Freq: Once | RESPIRATORY_TRACT | Status: AC
  Administered 2023-11-17: 3 mL via RESPIRATORY_TRACT
  Filled 2023-11-17: qty 3

## 2023-11-17 MED ORDER — ONDANSETRON HCL 4 MG/2ML IJ SOLN: 4.0000 mg | Freq: Four times a day (QID) | INTRAMUSCULAR | Status: DC | PRN

## 2023-11-17 MED ORDER — ACETAMINOPHEN 325 MG PO TABS: 650.0000 mg | ORAL_TABLET | Freq: Four times a day (QID) | ORAL | Status: DC | PRN

## 2023-11-17 MED ORDER — SODIUM CHLORIDE 0.9 % IV SOLN
2.0000 g | Freq: Once | INTRAVENOUS | Status: AC
  Administered 2023-11-17: 2 g via INTRAVENOUS
  Filled 2023-11-17: qty 20

## 2023-11-17 MED ORDER — LACTATED RINGERS IV BOLUS (SEPSIS)
250.0000 mL | Freq: Once | INTRAVENOUS | Status: AC
  Administered 2023-11-17: 250 mL via INTRAVENOUS

## 2023-11-17 MED ORDER — POTASSIUM CHLORIDE 10 MEQ/100ML IV SOLN
10.0000 meq | INTRAVENOUS | Status: AC
  Administered 2023-11-17 (×2): 10 meq via INTRAVENOUS
  Filled 2023-11-17 (×2): qty 100

## 2023-11-17 MED ORDER — ENOXAPARIN SODIUM 30 MG/0.3ML IJ SOSY
30.0000 mg | PREFILLED_SYRINGE | Freq: Every day | INTRAMUSCULAR | Status: DC
  Administered 2023-11-17 – 2023-11-20 (×4): 30 mg via SUBCUTANEOUS
  Filled 2023-11-17 (×4): qty 0.3

## 2023-11-17 MED ORDER — SODIUM CHLORIDE 0.9 % IV SOLN
2.0000 g | INTRAVENOUS | Status: AC
  Administered 2023-11-18 – 2023-11-21 (×4): 2 g via INTRAVENOUS
  Filled 2023-11-17 (×4): qty 20

## 2023-11-17 MED ORDER — SODIUM CHLORIDE 0.9 % IV SOLN
500.0000 mg | INTRAVENOUS | Status: DC
  Administered 2023-11-18 – 2023-11-19 (×2): 500 mg via INTRAVENOUS
  Filled 2023-11-17 (×3): qty 5

## 2023-11-17 MED ORDER — SODIUM CHLORIDE 0.9 % IV BOLUS: 1000.0000 mL | Freq: Once | INTRAVENOUS | Status: DC

## 2023-11-17 MED ORDER — LACTATED RINGERS IV SOLN: INTRAVENOUS | Status: AC

## 2023-11-17 MED ORDER — IOHEXOL 350 MG/ML SOLN
75.0000 mL | Freq: Once | INTRAVENOUS | Status: AC | PRN
  Administered 2023-11-17: 75 mL via INTRAVENOUS

## 2023-11-17 MED ORDER — METHYLPREDNISOLONE SODIUM SUCC 125 MG IJ SOLR
125.0000 mg | Freq: Once | INTRAMUSCULAR | Status: AC
  Administered 2023-11-17: 125 mg via INTRAVENOUS
  Filled 2023-11-17: qty 2

## 2023-11-17 MED ORDER — ACETAMINOPHEN 650 MG RE SUPP: 650.0000 mg | Freq: Four times a day (QID) | RECTAL | Status: DC | PRN

## 2023-11-17 MED ORDER — SODIUM CHLORIDE 0.9 % IV SOLN
500.0000 mg | Freq: Once | INTRAVENOUS | Status: AC
  Administered 2023-11-17: 500 mg via INTRAVENOUS
  Filled 2023-11-17: qty 5

## 2023-11-17 MED ORDER — ONDANSETRON HCL 4 MG PO TABS
4.0000 mg | ORAL_TABLET | Freq: Four times a day (QID) | ORAL | Status: DC | PRN
Start: 2023-11-17 — End: 2023-11-21

## 2023-11-17 NOTE — ED Notes (Signed)
 Report given to Almira Coaster, Charity fundraiser.

## 2023-11-17 NOTE — ED Provider Notes (Signed)
  EMERGENCY DEPARTMENT AT [facility] Provider Note   CSN: [id number] Arrival date & time: 11/17/23  1016     Patient presents with: Shortness of Breath (Pt endorses she has been short of breath for the last four days with sudden onset, denies long journeys recently. Denies chest pain, endorses congestion, was around son last week who was ill. No hx copd or heart failure) and Cough   Renee Stark is a 68 y.o. female.   Pt is a 68 yo female with pmhx significant for HIV.  When she was last here in April, she noted that she has not taken any meds or seen a doctor in a few years.  They did do a T4 Helper count then and CD4 count 132.  There are notes in Epic for pt to call and make an appointment.  However, she never did so.  She is here today with sob.  She denies fever.  She was around her son last week who had a cough.         Prior to Admission medications   Not on File    Allergies: Patient has no known allergies.    Review of Systems  Respiratory:  Positive for cough and shortness of breath.   All other systems reviewed and are negative.   Updated Vital Signs BP 129/75   Pulse 81   Temp 99.1 F (37.3 C) (Oral)   Resp (!) 30   Ht 5' 2 (1.575 m)   Wt 40.8 kg   SpO2 98%   BMI 16.46 kg/m   Physical Exam Vitals and nursing note reviewed.  Constitutional:      Appearance: She is underweight. She is ill-appearing.  HENT:     Head: Normocephalic and atraumatic.     Mouth/Throat:     Pharynx: Oropharynx is clear.   Eyes:     Extraocular Movements: Extraocular movements intact.     Pupils: Pupils are equal, round, and reactive to light.    Cardiovascular:     Rate and Rhythm: Normal rate and regular rhythm.  Pulmonary:     Effort: Tachypnea present.     Breath sounds: Wheezing and rhonchi present.  Abdominal:     General: Bowel sounds are normal.     Palpations: Abdomen is soft.   Musculoskeletal:        General: Normal range of  motion.     Cervical back: Normal range of motion and neck supple.   Skin:    General: Skin is warm.     Capillary Refill: Capillary refill takes less than 2 seconds.   Neurological:     General: No focal deficit present.     Mental Status: She is oriented to person, place, and time.   Psychiatric:        Mood and Affect: Mood normal.        Behavior: Behavior normal.     (all labs ordered are listed, but only abnormal results are displayed) Labs Reviewed  BASIC METABOLIC PANEL WITH GFR - Abnormal; Notable for the following components:      Result Value   Sodium 132 (*)    Potassium 2.9 (*)    Chloride 94 (*)    Glucose, Bld 125 (*)    Calcium 8.8 (*)    All other components within normal limits  CBC - Abnormal; Notable for the following components:   RBC 3.68 (*)    Hemoglobin 10.4 (*)    HCT 32.0 (*)  RDW 15.7 (*)    All other components within normal limits  D-DIMER, QUANTITATIVE - Abnormal; Notable for the following components:   D-Dimer, Quant 1.92 (*)    All other components within normal limits  T-HELPER CELLS (CD4) COUNT (NOT AT [facility]) - Abnormal; Notable for the following components:   CD4 T Cell Abs 49 (*)    CD4 % Helper T Cell 14 (*)    All other components within normal limits  RESP PANEL BY RT-PCR (RSV, FLU A&B, COVID)  RVPGX2  CULTURE, BLOOD (ROUTINE X 2)  CULTURE, BLOOD (ROUTINE X 2)  LACTIC ACID, PLASMA  LACTIC ACID, PLASMA  PROTIME-INR  MAGNESIUM  HIV-1 RNA QUANT-NO REFLEX-BLD  CBC  COMPREHENSIVE METABOLIC PANEL WITH GFR    EKG: EKG Interpretation Date/Time:  Tuesday November 17 2023 10:39:11 EDT Ventricular Rate:  98 PR Interval:  148 QRS Duration:  84 QT Interval:  350 QTC Calculation: 447 R Axis:   99  Text Interpretation: Sinus rhythm Probable lateral infarct, age indeterminate Artifact in lead(s) I III aVR aVL aVF and baseline wander in lead(s) V2 No significant change since last tracing Confirmed by Sueellen Emery [phone]) on  11/17/2023 11:20:15 AM  Radiology: CT Angio Chest PE W and/or Wo Contrast Result Date: 11/17/2023 CLINICAL DATA:  Pulmonary embolism EXAM: CT ANGIOGRAPHY CHEST WITH CONTRAST TECHNIQUE: Multidetector CT imaging of the chest was performed using the standard protocol during bolus administration of intravenous contrast. Multiplanar CT image reconstructions and MIPs were obtained to evaluate the vascular anatomy. Multiplanar image (3D post-processing) reconstructions and MIPs were obtained to evaluate the vascular anatomy. RADIATION DOSE REDUCTION: This exam was performed according to the departmental dose-optimization program which includes automated exposure control, adjustment of the mA and/or kV according to patient size and/or use of iterative reconstruction technique. CONTRAST:  75mL OMNIPAQUE IOHEXOL 350 MG/ML SOLN COMPARISON:  None Available. FINDINGS: Cardiovascular: Normal size heart. No pericardial effusion. Calcific atherosclerosis is present in the left and right coronary territories. There is no evidence of pulmonary embolism the segmental level. Main pulmonary artery is within normal limits for size. Three vessel aortic arch. The descending thoracic aorta is patent. Mediastinum/Nodes: Large hiatal hernia which contains stomach and colon. Lungs/Pleura: Multifocal airspace consolidation is present which is worst in the left lower lobe but patchy opacities are present as well in the right lower lobe, right upper lobe, left upper lobe, right middle lobe and lingula. No pleural effusion or pneumothorax. Upper Abdomen: No acute abnormality. Musculoskeletal: No chest wall abnormality. No acute or significant osseous findings. Review of the MIP images confirms the above findings. IMPRESSION: 1. No evidence of pulmonary embolism to the segmental level. 2. Multifocal airspace disease compatible with pneumonia which is worst in the left lower lobe. 3. Coronary artery atherosclerotic vascular disease.  Electronically Signed   By: Reagan Camera M.D.   On: 11/17/2023 15:03   DG Chest 2 View Result Date: 11/17/2023 CLINICAL DATA:  Shortness of breath EXAM: CHEST - 2 VIEW COMPARISON:  X-ray 09/08/2023 and older FINDINGS: Hyperinflation. Persistent opacity at the left lung base with a left-sided pleural effusion. Chronic lung changes elsewhere. No pneumothorax. Stable cardiopericardial silhouette. Calcified aorta. Frontal view is rotated to left. Osteopenia. Overlapping artifacts from clothing and patient's necklace. Known hiatal hernia. IMPRESSION: Persistent left-sided pleural effusion and opacity. Recommend continued follow-up. Electronically Signed   By: Adrianna Horde M.D.   On: 11/17/2023 12:36     Procedures   Medications Ordered in the ED  lactated ringers infusion (  Intravenous New Bag/Given 11/17/23 1515)  enoxaparin (LOVENOX) injection 30 mg (has no administration in time range)  acetaminophen (TYLENOL) tablet 650 mg (has no administration in time range)    Or  acetaminophen (TYLENOL) suppository 650 mg (has no administration in time range)  ondansetron  (ZOFRAN ) tablet 4 mg (has no administration in time range)    Or  ondansetron  (ZOFRAN ) injection 4 mg (has no administration in time range)  ipratropium-albuterol (DUONEB) 0.5-2.5 (3) MG/3ML nebulizer solution 3 mL (3 mLs Nebulization Given 11/17/23 1113)  methylPREDNISolone sodium succinate (SOLU-MEDROL) 125 mg/2 mL injection 125 mg (125 mg Intravenous Given 11/17/23 1149)  lactated ringers bolus 1,000 mL (0 mLs Intravenous Stopped 11/17/23 1256)    And  lactated ringers bolus 250 mL (0 mLs Intravenous Stopped 11/17/23 1405)  cefTRIAXone  (ROCEPHIN ) 2 g in sodium chloride  0.9 % 100 mL IVPB (0 g Intravenous Stopped 11/17/23 1222)  azithromycin  (ZITHROMAX ) 500 mg in sodium chloride  0.9 % 250 mL IVPB (0 mg Intravenous Stopped 11/17/23 1328)  potassium chloride  10 mEq in 100 mL IVPB (0 mEq Intravenous Stopped 11/17/23 1546)  iohexol (OMNIPAQUE) 350  MG/ML injection 75 mL (75 mLs Intravenous Contrast Given 11/17/23 1337)                                    Medical Decision Making Amount and/or Complexity of Data Reviewed Labs: ordered. Radiology: ordered.  Risk Prescription drug management. Decision regarding hospitalization.   This patient presents to the ED for concern of sob, this involves an extensive number of treatment options, and is a complaint that carries with it a high risk of complications and morbidity.  The differential diagnosis includes covid/flu/rsv, pna, bronchitis, PCP pna   Co morbidities that complicate the patient evaluation  Untreated HIV   Additional history obtained:  Additional history obtained from epic chart review  Lab Tests:  I Ordered, and personally interpreted labs.  The pertinent results include:  cbc with hgb low at 10.4 (hgb 11.5 in April); bmp with k low at 2.9; ddimer elevated at 1.92; inr nl; lactic nl; covid/flu/rsv neg   Imaging Studies ordered:  I ordered imaging studies including cxr and ct chest  I independently visualized and interpreted imaging which showed  CXR: Persistent left-sided pleural effusion and opacity. Recommend  continued follow-up.  CT chest: 1. No evidence of pulmonary embolism to the segmental level.  2. Multifocal airspace disease compatible with pneumonia which is  worst in the left lower lobe.  3. Coronary artery atherosclerotic vascular disease.   I agree with the radiologist interpretation   Cardiac Monitoring:  The patient was maintained on a cardiac monitor.  I personally viewed and interpreted the cardiac monitored which showed an underlying rhythm of: st   Medicines ordered and prescription drug management:  I ordered medication including ivfs/duoneb/steroids/abx  for sx  Reevaluation of the patient after these medicines showed that the patient improved I have reviewed the patients home medicines and have made adjustments as  needed   Test Considered:  ct   Critical Interventions:  Iv abx   Consultations Obtained:  I requested consultation with the ID (Dr. Zelda Hickman),  and discussed lab and imaging findings as well as pertinent plan - she will consult on pt tomorrow Pt d/w Dr. Bonita Bussing (triad) for admission   Problem List / ED Course:  Hypokalemia:  iv kcl given] Multifocal pneumonia:  IV rocephin /zithromax  given.  Code sepsis called.  Sepsis fluids given.   HIV with CD4 count only 49.  ID to consult   Reevaluation:  After the interventions noted above, I reevaluated the patient and found that they have :improved   Social Determinants of Health:  Lives at home   Dispostion:  After consideration of the diagnostic results and the patients response to treatment, I feel that the patent would benefit from admission.  CRITICAL CARE Performed by: Sueellen Emery   Total critical care time: 30 minutes  Critical care time was exclusive of separately billable procedures and treating other patients.  Critical care was necessary to treat or prevent imminent or life-threatening deterioration.  Critical care was time spent personally by me on the following activities: development of treatment plan with patient and/or surrogate as well as nursing, discussions with consultants, evaluation of patient's response to treatment, examination of patient, obtaining history from patient or surrogate, ordering and performing treatments and interventions, ordering and review of laboratory studies, ordering and review of radiographic studies, pulse oximetry and re-evaluation of patient's condition.        Final diagnoses:  Hypokalemia  Multifocal pneumonia  Symptomatic HIV infection [facility])    ED Discharge Orders     None          Sueellen Emery, MD 11/17/23 1705

## 2023-11-17 NOTE — Sepsis Progress Note (Signed)
 Elink will follow per sepsis protocol.

## 2023-11-17 NOTE — ED Notes (Signed)
 Oxygen inhalation at 3 LPM per nasal cannula started. Saturation=89% (room air)

## 2023-11-17 NOTE — H&P (Signed)
 History and Physical    Patient: Renee Stark ZOX:[id number] DOB: 30-Sep-1955 DOA: 11/17/2023 DOS: the patient was seen and examined on 11/17/2023 PCP: Patient, No Pcp Per  Patient coming from: Home  Chief Complaint:  Chief Complaint  Patient presents with   Shortness of Breath    Pt endorses she has been short of breath for the last four days with sudden onset, denies long journeys recently. Denies chest pain, endorses congestion, was around son last week who was ill. No hx copd or heart failure   Cough   HPI: Renee Stark is a 68 y.o. female with medical history significant of AIDS who is coming to the emergency department with complaints of cough, dyspnea, fatigue and for a week.  She is an active smoker.  She has not been taking antiretrovirals for a while.  She denied fever, chills, rhinorrhea, sore throat, wheezing or hemoptysis.  No chest pain, palpitations, diaphoresis, PND, orthopnea or pitting edema of the lower extremities.  No abdominal pain, nausea, emesis, diarrhea, constipation, melena or hematochezia.  No flank pain, dysuria, frequency or hematuria.  No polyuria, polydipsia, polyphagia or blurred vision.   Lab work: Coronavirus, influenza and RSV PCR was negative.  CBC showed a white count of 5.0, hemoglobin 10.4 g/dL platelets 811.  Normal PT and INR.  D-dimer was 1.90 BMP 32, potassium 2.9, chloride 94 and CO2 26 mmol/L.  Renal function was normal.  Glucose 125 calcium 8.8 mg/dL.  CD4 count was 49 CD4 helper T-cell 14.  Lactic acid x 2 normal.  Magnesium 1.9 mg/L.  Imaging: 2 view chest radiograph showing persistent left-sided pleural effusion and opacity.  CTA chest evidence of PE.  Multifocal airspace disease compatible with pneumonia which is worse in the left lower lobe.  Coronary artery disease.   ED course: Initial vital signs were temperature 98.5 F, pulse 96, respiration 20, BP 159/69 mmHg and O2 sat 96% on room air.  She received ceftriaxone , azithromycin  and IV  fluids.  Review of Systems: As mentioned in the history of present illness. All other systems reviewed and are negative.  Past Medical History:  Diagnosis Date   HIV (human immunodeficiency virus infection) (HCC)    History reviewed. No pertinent surgical history. Social History:  reports that she has been smoking. She has never used smokeless tobacco. She reports that she does not currently use alcohol. She reports that she does not currently use drugs.  No Known Allergies  History reviewed. No pertinent family history.  Prior to Admission medications   Not on File    Physical Exam: Vitals:   11/17/23 1300 11/17/23 1321 11/17/23 1422 11/17/23 1430  BP: (!) 145/72  135/84 130/63  Pulse: 96  96 98  Resp: (!) 30  (!) 22   Temp:   99.1 F (37.3 C)   TempSrc:   Oral   SpO2: 100%  96% 97%  Weight:  40.8 kg    Height:  5' 2 (1.575 m)     Physical Exam Vitals and nursing note reviewed.  Constitutional:      General: She is awake. She is not in acute distress.    Appearance: She is well-developed. She is ill-appearing.  HENT:     Head: Normocephalic.     Nose: No rhinorrhea.     Mouth/Throat:     Mouth: Mucous membranes are moist.   Eyes:     General: No scleral icterus.    Pupils: Pupils are equal, round, and reactive to light.  Neck:     Vascular: No JVD.   Cardiovascular:     Rate and Rhythm: Normal rate and regular rhythm.     Heart sounds: S1 normal and S2 normal.  Pulmonary:     Effort: Pulmonary effort is normal. No tachypnea.     Breath sounds: Examination of the right-lower field reveals rales. Examination of the left-lower field reveals rales. Rales present. No wheezing or rhonchi.  Abdominal:     General: There is no distension.     Palpations: Abdomen is soft.     Tenderness: There is no abdominal tenderness.   Musculoskeletal:     Cervical back: Neck supple.     Right lower leg: No edema.     Left lower leg: No edema.   Skin:    General: Skin  is warm and dry.   Neurological:     General: No focal deficit present.     Mental Status: She is alert and oriented to person, place, and time.   Psychiatric:        Mood and Affect: Mood normal.        Behavior: Behavior normal. Behavior is cooperative.     Data Reviewed:  Results are pending, will review when available.  Assessment and Plan: Principal Problem:   CAP (community acquired pneumonia) Admit to PCU/inpatient. Continue supplemental oxygen. Scheduled and as needed bronchodilators. Continue ceftriaxone  1 g IVPB daily. Continue azithromycin  500 mg IVPB daily. Check strep pneumoniae urinary antigen. Check sputum Gram stain, culture and sensitivity. Follow-up blood culture and sensitivity. Follow-up CBC and chemistry in the morning.  Active Problems:   Cigarette smoker Tobacco cessation advised. Nicotine replacement therapy as needed.    AIDS (acquired immune deficiency syndrome) (HCC) Will be seen by ID tomorrow.    Hyperlipidemia Currently not on medical therapy.    Advance Care Planning:   Code Status: Full Code   Consults:   Family Communication:   Severity of Illness: The appropriate patient status for this patient is INPATIENT. Inpatient status is judged to be reasonable and necessary in order to provide the required intensity of service to ensure the patient's safety. The patient's presenting symptoms, physical exam findings, and initial radiographic and laboratory data in the context of their chronic comorbidities is felt to place them at high risk for further clinical deterioration. Furthermore, it is not anticipated that the patient will be medically stable for discharge from the hospital within 2 midnights of admission.   * I certify that at the point of admission it is my clinical judgment that the patient will require inpatient hospital care spanning beyond 2 midnights from the point of admission due to high intensity of service, high risk for  further deterioration and high frequency of surveillance required.*  Author: Danice Dural, MD 11/17/2023 4:57 PM  For on call review [email or web address].   This document was prepared using Dragon voice recognition software and may contain some unintended transcription errors.

## 2023-11-17 NOTE — Plan of Care (Signed)
  Problem: Education: Goal: Knowledge of General Education information will improve Description Including pain rating scale, medication(s)/side effects and non-pharmacologic comfort measures Outcome: Not Progressing   Problem: Clinical Measurements: Goal: Ability to maintain clinical measurements within normal limits will improve Outcome: Not Progressing   Problem: Nutrition: Goal: Adequate nutrition will be maintained Outcome: Not Progressing   

## 2023-11-17 NOTE — Plan of Care (Signed)

## 2023-11-18 DIAGNOSIS — E871 Hypo-osmolality and hyponatremia: Secondary | ICD-10-CM

## 2023-11-18 DIAGNOSIS — F17210 Nicotine dependence, cigarettes, uncomplicated: Secondary | ICD-10-CM

## 2023-11-18 DIAGNOSIS — B20 Human immunodeficiency virus [HIV] disease: Secondary | ICD-10-CM

## 2023-11-18 DIAGNOSIS — E876 Hypokalemia: Principal | ICD-10-CM

## 2023-11-18 LAB — HEPATITIS PANEL, ACUTE
HCV Ab: NONREACTIVE
Hep A IgM: NONREACTIVE
Hep B C IgM: NONREACTIVE
Hepatitis B Surface Ag: NONREACTIVE

## 2023-11-18 LAB — CBC
HCT: 34.9 % — ABNORMAL LOW (ref 36.0–46.0)
Hemoglobin: 11 g/dL — ABNORMAL LOW (ref 12.0–15.0)
MCH: 28.2 pg (ref 26.0–34.0)
MCHC: 31.5 g/dL (ref 30.0–36.0)
MCV: 89.5 fL (ref 80.0–100.0)
Platelets: 192 10*3/uL (ref 150–400)
RBC: 3.9 MIL/uL (ref 3.87–5.11)
RDW: 15.7 % — ABNORMAL HIGH (ref 11.5–15.5)
WBC: 4.8 10*3/uL (ref 4.0–10.5)
nRBC: 0 % (ref 0.0–0.2)

## 2023-11-18 LAB — COMPREHENSIVE METABOLIC PANEL WITH GFR
ALT: 15 U/L (ref 0–44)
AST: 34 U/L (ref 15–41)
Albumin: 2.5 g/dL — ABNORMAL LOW (ref 3.5–5.0)
Alkaline Phosphatase: 896 U/L — ABNORMAL HIGH (ref 38–126)
Anion gap: 10 (ref 5–15)
BUN: 13 mg/dL (ref 8–23)
CO2: 25 mmol/L (ref 22–32)
Calcium: 8.5 mg/dL — ABNORMAL LOW (ref 8.9–10.3)
Chloride: 98 mmol/L (ref 98–111)
Creatinine, Ser: 0.41 mg/dL — ABNORMAL LOW (ref 0.44–1.00)
GFR, Estimated: >60 mL/min (ref >60)
Glucose, Bld: 157 mg/dL — ABNORMAL HIGH (ref 70–99)
Potassium: 3.1 mmol/L — ABNORMAL LOW (ref 3.5–5.1)
Sodium: 133 mmol/L — ABNORMAL LOW (ref 135–145)
Total Bilirubin: 0.6 mg/dL (ref 0.0–1.2)
Total Protein: 7.2 g/dL (ref 6.5–8.1)

## 2023-11-18 LAB — HIV-1 RNA QUANT-NO REFLEX-BLD
HIV 1 RNA Quant: 303000 {copies}/mL
LOG10 HIV-1 RNA: 5.481 {Log_copies}/mL

## 2023-11-18 LAB — LACTATE DEHYDROGENASE: LDH: 206 U/L — ABNORMAL HIGH (ref 98–192)

## 2023-11-18 MED ORDER — POTASSIUM CHLORIDE 20 MEQ PO PACK
40.0000 meq | PACK | Freq: Two times a day (BID) | ORAL | Status: DC
  Administered 2023-11-18: 40 meq via ORAL
  Filled 2023-11-18 (×2): qty 2

## 2023-11-18 MED ORDER — GUAIFENESIN-DM 100-10 MG/5ML PO SYRP
5.0000 mL | ORAL_SOLUTION | ORAL | Status: DC | PRN
  Administered 2023-11-18 – 2023-11-19 (×2): 5 mL via ORAL
  Filled 2023-11-18 (×2): qty 5

## 2023-11-18 MED ORDER — BICTEGRAVIR-EMTRICITAB-TENOFOV 50-200-25 MG PO TABS
1.0000 | ORAL_TABLET | Freq: Every day | ORAL | Status: DC
  Administered 2023-11-18 – 2023-11-21 (×4): 1 via ORAL
  Filled 2023-11-18 (×4): qty 1

## 2023-11-18 MED ORDER — DARUNAVIR-COBICISTAT 800-150 MG PO TABS
1.0000 | ORAL_TABLET | Freq: Every day | ORAL | Status: DC
  Administered 2023-11-18 – 2023-11-21 (×4): 1 via ORAL
  Filled 2023-11-18 (×4): qty 1

## 2023-11-18 NOTE — Consult Note (Signed)
 [facility]    Date of Admission:  11/17/2023   Total days of inpatient antibiotics 1        Reason for Consult: PNA    Principal Problem:   CAP (community acquired pneumonia) Active Problems:   Cigarette smoker   AIDS (acquired immune deficiency syndrome) (HCC)   Hyperlipidemia   Assessment: 68 year old female with history of HIV/AIDS nonadherent to ART multiple mutations admitted with: #Community-acquired pneumonia #Concern for PJP pneumonia - Patient is on nasal cannula 3 L.  She states she has had a productive cough for a while.  Symptoms cough shortness of breath have worked well over the past week.  Will get Fungitell and workup for PJP pneumonia.  At this point we will follow clinically hold off on empirically starting for PJP pneumonia.  Of note her CD4 count 1 from 132 16% on 4/8/ to 49 at 14% during this hospitalization.  I suspect some of the decline in CD4 counts due to acute illness. Plan:  -Continue ctx and metro -Sputum Cx, PJP  smear -Fungitell, ldh, legionella nd strep urine   #HIV /AIDS -CD4 49(16%), bvl 303 k -Pt previously on prezista, tivicay , norvir and viread . Followed by [facility], last seen on 08/02/19. -Pt states she has been off of ART for about 3 years. She states she does not like taking large pill. -Reviewed resistance: RT mutation: M41L, M184V, T215Y, Y188L .PI mutation K20K/R, M36I, D30N. No IH mutation.  Plan: Start Biktarvy and prezcobix(can be crushed). Appreciate pharmacy input RPR, Acute hep panel.   Evaluation of this patient requires complex antimicrobial therapy evaluation and counseling + isolation needs for disease transmission risk assessment and mitigation   Microbiology:   Antibiotics: Ctx and metro 6/16-    HPI: Renee Stark is a 68 y.o. female with past medical history of HIV/AIDS nonadherent to ART, previously followed by [facility] infectious disease, tobacco abuse  presented with cough, dyspnea and fever fatigue for about a week.  On arrival patient afebrile WBC 5K.  Chest radiograph showed left-sided pleural effusion possibly.  CTA showed multifocal airspace disease compatible with pneumonia which is worse on left lower lobe.  Started on ceftriaxone  with Romycin.  ID engaged given history of HIV/AIDS   Review of Systems: Review of Systems  All other systems reviewed and are negative.   Past Medical History:  Diagnosis Date   HIV (human immunodeficiency virus infection) (HCC)     Social History   Tobacco Use   Smoking status: Every Day   Smokeless tobacco: Never  Substance Use Topics   Alcohol use: Not Currently   Drug use: Not Currently    History reviewed. No pertinent family history. Scheduled Meds:  bictegravir-emtricitabine-tenofovir AF  1 tablet Oral Daily   darunavir-cobicistat  1 tablet Oral Q breakfast   enoxaparin (LOVENOX) injection  30 mg Subcutaneous QHS   potassium chloride   40 mEq Oral BID   Continuous Infusions:  azithromycin  500 mg (11/18/23 1113)   cefTRIAXone  (ROCEPHIN )  IV 2 g (11/18/23 1217)   PRN Meds:.acetaminophen **OR** acetaminophen, guaiFENesin-dextromethorphan, ondansetron  **OR** ondansetron  (ZOFRAN ) IV No Known Allergies  OBJECTIVE: Blood pressure 119/66, pulse 72, temperature (!) 97.5 F (36.4 C), resp. rate 18, height 5' 2 (1.575 m), weight 40.8 kg, SpO2 (!) 83%.  Physical Exam Constitutional:      Appearance: Normal appearance.  HENT:     Head: Normocephalic and atraumatic.  Right Ear: Tympanic membrane normal.     Left Ear: Tympanic membrane normal.     Nose: Nose normal.     Mouth/Throat:     Mouth: Mucous membranes are moist.   Eyes:     Extraocular Movements: Extraocular movements intact.     Conjunctiva/sclera: Conjunctivae normal.     Pupils: Pupils are equal, round, and reactive to light.    Cardiovascular:     Rate and Rhythm: Normal rate and regular rhythm.     Heart  sounds: No murmur heard.    No friction rub. No gallop.  Pulmonary:     Effort: Pulmonary effort is normal.  Abdominal:     General: Abdomen is flat.     Palpations: Abdomen is soft.   Musculoskeletal:        General: Normal range of motion.   Skin:    General: Skin is warm and dry.   Neurological:     General: No focal deficit present.     Mental Status: She is alert and oriented to person, place, and time.   Psychiatric:        Mood and Affect: Mood normal.     Lab Results Lab Results  Component Value Date   WBC 4.8 11/18/2023   HGB 11.0 (L) 11/18/2023   HCT 34.9 (L) 11/18/2023   MCV 89.5 11/18/2023   PLT 192 11/18/2023    Lab Results  Component Value Date   CREATININE 0.41 (L) 11/18/2023   BUN 13 11/18/2023   NA 133 (L) 11/18/2023   K 3.1 (L) 11/18/2023   CL 98 11/18/2023   CO2 25 11/18/2023    Lab Results  Component Value Date   ALT 15 11/18/2023   AST 34 11/18/2023   ALKPHOS 896 (H) 11/18/2023   BILITOT 0.6 11/18/2023       Orlie Bjornstad, MD [facility] 11/18/2023, 4:02 PM

## 2023-11-18 NOTE — Progress Notes (Addendum)
 HIV Genotype Composite Data Genotype Dates: 2002   Renee Stark was diagnosed in the late 1990s. She has had exposure to nelfinavir, tenofovir, abacavir, lamivudine, zidovudine, neviripine, and lopinavir/ritonavir.   The regimen she was on most recently was dolutegravir + darunavir/ritonavir + TDF.   Mutations in [place] impact drug susceptibility RT Mutations M41L, M184V, T215Y, Y188L   PI Mutations K20K/R, M36I, D30N  Integrase Mutations    Interpretation of Genotype Data per [employer] HIV Database Nucleoside RTIs  abacavir (ABC) Intermediate Resistance zidovudine (AZT) High-Level Resistance emtricitabine (FTC) High-Level Resistance lamivudine (3TC) High-Level Resistance tenofovir (TDF) Low-Level Resistance   Non-Nucleoside RTIs  doravirine (DOR) High-Level Resistance efavirenz (EFV) High-Level Resistance etravirine (ETR) Potential Low-Level Resistance nevirapine (NVP) High-Level Resistance rilpivirine (RPV) High-Level Resistance   Protease Inhibitors  atazanavir/r (ATV/r) Susceptible darunavir/r (DRV/r) Susceptible lopinavir/r (LPV/r) Susceptible   Integrase Inhibitors  No resistance mutations     Given resistance pattern, will start Biktarvy + Prezcobix. Patient noted trouble with swallowing large pills so will crush prezcobix and mix it with applesauce.   Denson Flake, PharmD, BCPS, BCIDP Infectious Diseases Clinical Pharmacist Phone: [phone] 11/18/2023 9:10 AM

## 2023-11-18 NOTE — Progress Notes (Signed)
 Progress Note   Patient: Darryl Blumenstein ZOX:[id number] DOB: Nov 23, 1955 DOA: 11/17/2023     1 DOS: the patient was seen and examined on 11/18/2023   Brief hospital course: Tascha Casares is a 68 y.o. female with medical history significant of AIDS who is coming to the emergency department with complaints of cough, dyspnea, fatigue and for a week. Admitted to TR[facility] service for pneumonia, ID consulted.  Assessment and Plan: Community acquired pneumonia:. Continue supplemental oxygen to maintain saturation above 92%. Scheduled and as needed bronchodilators. Continue ceftriaxone  1 g IVPB daily. Continue azithromycin  500 mg IVPB daily. Strep pneumoniae urinary antigen pending. Check sputum Gram stain, culture and sensitivity. Follow-up blood culture and sensitivity.  Hyponatremia Seems chronic. Sodium stable.  Hypokalemia IV potassium replacements given. Today K 3.1, will give oral potassium supplements   Cigarette smoker Tobacco cessation advised. Advised nicotine patch.   AIDS (acquired immune deficiency syndrome) (HCC) Not on HAART meds at home. ID evaluation called.   Hyperlipidemia Not on statin at home.  Severe protein malnutrition: RD consult placed. Encourage oral diet, supplements.      Out of bed to chair. Incentive spirometry. Nursing supportive care. Fall, aspiration precautions. Diet:  Diet Orders (From admission, onward)     Start     Ordered   11/18/23 1048  Diet regular Room service appropriate? Yes; Fluid consistency: Thin  Diet effective now       Question Answer Comment  Room service appropriate? Yes   Fluid consistency: Thin      11/18/23 1047           DVT prophylaxis: enoxaparin (LOVENOX) injection 30 mg Start: 11/17/23 2200  Level of care: Telemetry   Code Status: Full Code  Subjective: Patient is seen and examined today morning. Asks if she can eat regular diet, does not want cardiac diet. Did not get out of bed. Feels  weak.  Physical Exam: Vitals:   11/18/23 0549 11/18/23 0600 11/18/23 0747 11/18/23 0903  BP: 129/70  119/66   Pulse: 71  72   Resp: 16  18   Temp: 97.6 F (36.4 C)  (!) 97.5 F (36.4 C)   TempSrc:      SpO2: 98% 98% 99% (!) 83%  Weight:      Height:        General - Elderly thin built Caucasian female, no apparent distress HEENT - PERRLA, EOMI, atraumatic head, non tender sinuses. Lung - Clear, basal rales,no  rhonchi, wheezes. Heart - S1, S2 heard, no murmurs, rubs, trace pedal edema. Abdomen - Soft, non tender, bowel sounds good Neuro - Alert, awake and oriented x 3, non focal exam. Skin - Warm and dry.  Data Reviewed:      Latest Ref Rng & Units 11/18/2023    5:23 AM 11/17/2023   11:39 AM 09/08/2023   12:53 PM  CBC  WBC 4.0 - 10.5 K/uL 4.8  5.0  6.9   Hemoglobin 12.0 - 15.0 g/dL 81.1  91.4  78.2   Hematocrit 36.0 - 46.0 % 34.9  32.0  35.7   Platelets 150 - 400 K/uL 192  204  237       Latest Ref Rng & Units 11/18/2023    5:23 AM 11/17/2023   11:39 AM 09/08/2023   12:53 PM  BMP  Glucose 70 - 99 mg/dL 956  213  086   BUN 8 - 23 mg/dL 13  15  12    Creatinine 0.44 - 1.00 mg/dL 5.78  4.69  0.50   Sodium 135 - 145 mmol/L 133  132  127   Potassium 3.5 - 5.1 mmol/L 3.1  2.9  3.0   Chloride 98 - 111 mmol/L 98  94  92   CO2 22 - 32 mmol/L 25  26  23    Calcium 8.9 - 10.3 mg/dL 8.5  8.8  8.7    CT Angio Chest PE W and/or Wo Contrast Result Date: 11/17/2023 CLINICAL DATA:  Pulmonary embolism EXAM: CT ANGIOGRAPHY CHEST WITH CONTRAST TECHNIQUE: Multidetector CT imaging of the chest was performed using the standard protocol during bolus administration of intravenous contrast. Multiplanar CT image reconstructions and MIPs were obtained to evaluate the vascular anatomy. Multiplanar image (3D post-processing) reconstructions and MIPs were obtained to evaluate the vascular anatomy. RADIATION DOSE REDUCTION: This exam was performed according to the departmental dose-optimization program  which includes automated exposure control, adjustment of the mA and/or kV according to patient size and/or use of iterative reconstruction technique. CONTRAST:  75mL OMNIPAQUE IOHEXOL 350 MG/ML SOLN COMPARISON:  None Available. FINDINGS: Cardiovascular: Normal size heart. No pericardial effusion. Calcific atherosclerosis is present in the left and right coronary territories. There is no evidence of pulmonary embolism the segmental level. Main pulmonary artery is within normal limits for size. Three vessel aortic arch. The descending thoracic aorta is patent. Mediastinum/Nodes: Large hiatal hernia which contains stomach and colon. Lungs/Pleura: Multifocal airspace consolidation is present which is worst in the left lower lobe but patchy opacities are present as well in the right lower lobe, right upper lobe, left upper lobe, right middle lobe and lingula. No pleural effusion or pneumothorax. Upper Abdomen: No acute abnormality. Musculoskeletal: No chest wall abnormality. No acute or significant osseous findings. Review of the MIP images confirms the above findings. IMPRESSION: 1. No evidence of pulmonary embolism to the segmental level. 2. Multifocal airspace disease compatible with pneumonia which is worst in the left lower lobe. 3. Coronary artery atherosclerotic vascular disease. Electronically Signed   By: Reagan Camera M.D.   On: 11/17/2023 15:03   DG Chest 2 View Result Date: 11/17/2023 CLINICAL DATA:  Shortness of breath EXAM: CHEST - 2 VIEW COMPARISON:  X-ray 09/08/2023 and older FINDINGS: Hyperinflation. Persistent opacity at the left lung base with a left-sided pleural effusion. Chronic lung changes elsewhere. No pneumothorax. Stable cardiopericardial silhouette. Calcified aorta. Frontal view is rotated to left. Osteopenia. Overlapping artifacts from clothing and patient's necklace. Known hiatal hernia. IMPRESSION: Persistent left-sided pleural effusion and opacity. Recommend continued follow-up.  Electronically Signed   By: Adrianna Horde M.D.   On: 11/17/2023 12:36    Family Communication: Discussed with patient, she understand and agree. All questions answered.  Disposition: Status is: Inpatient Remains inpatient appropriate because: IV antibiotic therapy  Planned Discharge Destination: Home     Time spent: 38 minutes  Author: Aisha Hove, MD 11/18/2023 10:54 AM Secure chat 7am to 7pm For on call review [email or web address].

## 2023-11-18 NOTE — Progress Notes (Signed)
 Mobility Specialist - Progress Note   11/18/23 0903  Oxygen Therapy  SpO2 (!) 83 %  O2 Device Nasal Cannula  O2 Flow Rate (L/min) 2 L/min  Patient Activity (if Appropriate) Ambulating  Mobility  Activity Ambulated with assistance in hallway  Level of Assistance Modified independent, requires aide device or extra time  Assistive Device Other (Comment) (IV Pole)  Distance Ambulated (ft) 460 ft  Activity Response Tolerated well  Mobility Referral Yes  Mobility visit 1 Mobility  Mobility Specialist Start Time (ACUTE ONLY) 0849  Mobility Specialist Stop Time (ACUTE ONLY) 0902  Mobility Specialist Time Calculation (min) (ACUTE ONLY) 13 min   Pt received in bed and agreeable to mobility. During ambulation, pt desat to 85%, able to increase to 93% in ~69min with pursed lip breaths. No complaints during session.  Pt to EOB after session with all needs met.    Pre-mobility: 93% SpO2 (2L [place]) During mobility: 85-93% SpO2 (2L [place]) Post-mobility: 96% SPO2 (2L [place])  Chief Technology Officer

## 2023-11-18 NOTE — Plan of Care (Signed)
  Problem: Clinical Measurements: Goal: Respiratory complications will improve Outcome: Progressing   Problem: Nutrition: Goal: Adequate nutrition will be maintained Outcome: Progressing   Problem: Safety: Goal: Ability to remain free from injury will improve Outcome: Progressing   

## 2023-11-18 NOTE — TOC Initial Note (Signed)
 Transition of Care [facility]) - Initial/Assessment Note    Patient Details  Name: Renee Stark MRN: [id number] Date of Birth: 1955-07-08  Transition of Care [facility]) CM/SW Contact:    Marty Sleet, LCSW Phone Number: 11/18/2023, 12:30 PM  Clinical Narrative:                 Met with pt as no insurance and no PCP listed. Pt reports having a place to live and shares sometimes she lives with people and sometimes she does not. Pt vague about living situation.  Pt denies having DME or needing DME. Pt confirms she does not have insurance. CSW asked whether she has tried to obtain Medicare yet as she qualifies based on age. Pt shares she has not applied for Medicare as she cannot afford it and needs to first get social security. Pt reports she has not yet applied for social security. CSW encouraged pt to complete this. Pt is agreeable to have PCP appointment scheduled but, only at a [facility] as she cannot afford appointments. Pt concerned with obtaining medications at discharge. CSW will follow for [facility] [employer] and provided resource for [facility] for reduced cost medications.  Pt currently requiring O2. CSW will follow for O2 need at discharge.    Expected Discharge Plan: Home/Self Care Barriers to Discharge: Continued Medical Work up   Patient Goals and CMS Choice Patient states their goals for this hospitalization and ongoing recovery are:: To get my medications          Expected Discharge Plan and Services In-house Referral: NA Discharge Planning Services: NA Post Acute Care Choice: NA Living arrangements for the past 2 months: Single Family Home                                      Prior Living Arrangements/Services Living arrangements for the past 2 months: Single Family Home Lives with:: Self, Roommate, Friends Patient language and need for interpreter reviewed:: Yes Do you feel safe going back to the place where you live?: Yes      Need for  Family Participation in Patient Care: No (Comment) Care giver support system in place?: No (comment)   Criminal Activity/Legal Involvement Pertinent to Current Situation/Hospitalization: No - Comment as needed  Activities of Daily Living      Permission Sought/Granted   Permission granted to share information with : No              Emotional Assessment Appearance:: Appears stated age Attitude/Demeanor/Rapport: Guarded Affect (typically observed): Accepting, Flat Orientation: : Oriented to Self, Oriented to Place, Oriented to  Time, Oriented to Situation Alcohol / Substance Use: Not Applicable Psych Involvement: No (comment)  Admission diagnosis:  Hypokalemia [E87.6] CAP (community acquired pneumonia) [J18.9] Symptomatic HIV infection (HCC) [B20] Multifocal pneumonia [J18.9] Patient Active Problem List   Diagnosis Date Noted   CAP (community acquired pneumonia) 11/17/2023   Cigarette smoker 11/17/2023   Hyperlipidemia 11/17/2023   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 Jonette Nestle, [place] - [address] AT [facility] [address] [place] [address] Phone: [phone] Fax: [phone]     Social Drivers of Health (SDOH) Social History: SDOH Screenings   Tobacco Use: High  Risk (11/17/2023)   SDOH Interventions:     Readmission Risk Interventions     No data to display

## 2023-11-18 NOTE — Plan of Care (Signed)

## 2023-11-19 LAB — BASIC METABOLIC PANEL WITH GFR
Anion gap: 11 (ref 5–15)
BUN: 14 mg/dL (ref 8–23)
CO2: 27 mmol/L (ref 22–32)
Calcium: 8.6 mg/dL — ABNORMAL LOW (ref 8.9–10.3)
Chloride: 99 mmol/L (ref 98–111)
Creatinine, Ser: 0.53 mg/dL (ref 0.44–1.00)
GFR, Estimated: >60 mL/min (ref >60)
Glucose, Bld: 128 mg/dL — ABNORMAL HIGH (ref 70–99)
Potassium: 3.1 mmol/L — ABNORMAL LOW (ref 3.5–5.1)
Sodium: 137 mmol/L (ref 135–145)

## 2023-11-19 LAB — RPR: RPR Ser Ql: NONREACTIVE

## 2023-11-19 MED ORDER — POTASSIUM CHLORIDE CRYS ER 20 MEQ PO TBCR
40.0000 meq | EXTENDED_RELEASE_TABLET | Freq: Two times a day (BID) | ORAL | Status: DC
  Administered 2023-11-19: 40 meq via ORAL
  Filled 2023-11-19 (×2): qty 2

## 2023-11-19 NOTE — Plan of Care (Signed)

## 2023-11-19 NOTE — TOC Progression Note (Signed)
 Transition of Care [facility]) - Progression Note    Patient Details  Name: Renee Stark MRN: [id number] Date of Birth: [birth year]  Transition of Care [facility]) CM/SW Contact  Marty Sleet, LCSW Phone Number: 11/19/2023, 2:02 PM  Clinical Narrative:    CSW referred pt to community agencies through [facility] to assist with obtaining her Social Security and Medicare benefits. Pt shares that she met with a Financial Navigator earlier who is to assist her with applying for Medicaid.  Pt has PCP appointment scheduled at [facility] on 01/29/24 at 9:20am. Information placed on AVS.    Expected Discharge Plan: Home/Self Care Barriers to Discharge: Continued Medical Work up  Expected Discharge Plan and Services In-house Referral: NA Discharge Planning Services: NA Post Acute Care Choice: NA Living arrangements for the past 2 months: Single Family Home                                       Social Determinants of Health (SDOH) Interventions SDOH Screenings   Food Insecurity: No Food Insecurity (11/19/2023)  Housing: Low Risk  (11/19/2023)  Transportation Needs: No Transportation Needs (11/19/2023)  Utilities: Not At Risk (11/19/2023)  Social Connections: Socially Isolated (11/19/2023)  Tobacco Use: High Risk (11/17/2023)    Readmission Risk Interventions     No data to display

## 2023-11-19 NOTE — Progress Notes (Signed)
 Progress Note   Patient: Renee Stark XBM:[id number] DOB: 02-21-1956 DOA: 11/17/2023     2 DOS: the patient was seen and examined on 11/19/2023   Brief hospital course: Renee Stark is a 68 y.o. female with medical history significant of AIDS who is coming to the emergency department with complaints of cough, dyspnea, fatigue and for a week. Admitted to TRH service for pneumonia, ID consulted, started her on HAART meds.   Assessment and Plan: Community acquired pneumonia:. Continue supplemental oxygen to maintain saturation above 92%. Scheduled and as needed bronchodilators. Continue ceftriaxone , azithromycin . Follow sputum cx, PJP smear, fugitell beta d-glucan. Follow-up blood culture and sensitivity.  Hyponatremia Seems chronic. Sodium improved.  Hypokalemia IV potassium replacements given. Today K 3.1, will give oral potassium supplements. She does not want powder, changed to tablet form.   Cigarette smoker Tobacco cessation advised. Advised nicotine patch.   AIDS (acquired immune deficiency syndrome) (HCC) HIV 1 RNA 303K. Not on HAART meds at home. ID evaluation called.   Hyperlipidemia Not on statin at home.  Severe protein malnutrition: RD consult placed. Encourage oral diet, supplements.      Out of bed to chair. Incentive spirometry. Nursing supportive care. Fall, aspiration precautions. Diet:  Diet Orders (From admission, onward)     Start     Ordered   11/18/23 1048  Diet regular Room service appropriate? Yes; Fluid consistency: Thin  Diet effective now       Question Answer Comment  Room service appropriate? Yes   Fluid consistency: Thin      11/18/23 1047           DVT prophylaxis: enoxaparin (LOVENOX) injection 30 mg Start: 11/17/23 2200  Level of care: Telemetry   Code Status: Full Code  Subjective: Patient is seen and examined today morning. Eating fair. Feels better today. Currently on 2L supplemental O2. Cough better.  Physical  Exam: Vitals:   11/18/23 1706 11/18/23 1923 11/18/23 2022 11/19/23 0556  BP: 118/67  127/64 123/70  Pulse: 79 84 79 61  Resp: 18  16 18   Temp: 97.6 F (36.4 C)  98.1 F (36.7 C) 97.7 F (36.5 C)  TempSrc:   Oral Oral  SpO2: 100% 97% 100% 100%  Weight:      Height:        General - Elderly thin built Caucasian female, no apparent distress HEENT - PERRLA, EOMI, atraumatic head, non tender sinuses. Lung - Clear, basal rales,no  rhonchi, wheezes. Heart - S1, S2 heard, no murmurs, rubs, trace pedal edema. Abdomen - Soft, non tender, bowel sounds good Neuro - Alert, awake and oriented x 3, non focal exam. Skin - Warm and dry.  Data Reviewed:      Latest Ref Rng & Units 11/18/2023    5:23 AM 11/17/2023   11:39 AM 09/08/2023   12:53 PM  CBC  WBC 4.0 - 10.5 K/uL 4.8  5.0  6.9   Hemoglobin 12.0 - 15.0 g/dL 02.7  25.3  66.4   Hematocrit 36.0 - 46.0 % 34.9  32.0  35.7   Platelets 150 - 400 K/uL 192  204  237       Latest Ref Rng & Units 11/19/2023    5:35 AM 11/18/2023    5:23 AM 11/17/2023   11:39 AM  BMP  Glucose 70 - 99 mg/dL 403  474  259   BUN 8 - 23 mg/dL 14  13  15    Creatinine 0.44 - 1.00 mg/dL 5.63  8.75  0.55   Sodium 135 - 145 mmol/L 137  133  132   Potassium 3.5 - 5.1 mmol/L 3.1  3.1  2.9   Chloride 98 - 111 mmol/L 99  98  94   CO2 22 - 32 mmol/L 27  25  26    Calcium 8.9 - 10.3 mg/dL 8.6  8.5  8.8    No results found.   Family Communication: Discussed with patient, she understand and agree. All questions answered.  Disposition: Status is: Inpatient Remains inpatient appropriate because: IV antibiotic therapy  Planned Discharge Destination: Home     Time spent: 39 minutes  Author: Aisha Hove, MD 11/19/2023 2:25 PM Secure chat 7am to 7pm For on call review [email or web address].

## 2023-11-20 ENCOUNTER — Other Ambulatory Visit (HOSPITAL_COMMUNITY): Payer: Self-pay

## 2023-11-20 ENCOUNTER — Other Ambulatory Visit: Payer: Self-pay

## 2023-11-20 ENCOUNTER — Telehealth (HOSPITAL_COMMUNITY): Payer: Self-pay | Admitting: Pharmacy Technician

## 2023-11-20 DIAGNOSIS — E43 Unspecified severe protein-calorie malnutrition: Secondary | ICD-10-CM | POA: Insufficient documentation

## 2023-11-20 LAB — BASIC METABOLIC PANEL WITH GFR
Anion gap: 10 (ref 5–15)
BUN: 9 mg/dL (ref 8–23)
CO2: 30 mmol/L (ref 22–32)
Calcium: 8.6 mg/dL — ABNORMAL LOW (ref 8.9–10.3)
Chloride: 97 mmol/L — ABNORMAL LOW (ref 98–111)
Creatinine, Ser: 0.44 mg/dL (ref 0.44–1.00)
GFR, Estimated: >60 mL/min (ref >60)
Glucose, Bld: 104 mg/dL — ABNORMAL HIGH (ref 70–99)
Potassium: 3 mmol/L — ABNORMAL LOW (ref 3.5–5.1)
Sodium: 137 mmol/L (ref 135–145)

## 2023-11-20 LAB — CULTURE, BLOOD (ROUTINE X 2)

## 2023-11-20 LAB — FUNGITELL BETA-D-GLUCAN
Fungitell Value:: <31.25 pg/mL
Result Name:: NEGATIVE

## 2023-11-20 MED ORDER — AZITHROMYCIN 250 MG PO TABS
500.0000 mg | ORAL_TABLET | Freq: Every day | ORAL | Status: AC
  Administered 2023-11-20 – 2023-11-21 (×2): 500 mg via ORAL
  Filled 2023-11-20 (×2): qty 2

## 2023-11-20 MED ORDER — ADULT MULTIVITAMIN W/MINERALS CH
1.0000 | ORAL_TABLET | Freq: Every day | ORAL | Status: DC
  Administered 2023-11-20 – 2023-11-21 (×2): 1 via ORAL
  Filled 2023-11-20 (×2): qty 1

## 2023-11-20 MED ORDER — ADULT MULTIVITAMIN W/MINERALS CH
1.0000 | ORAL_TABLET | Freq: Every day | ORAL | 1 refills | Status: AC
Start: 2023-11-21 — End: ?
  Filled 2023-11-20: qty 90, 90d supply, fill #0

## 2023-11-20 MED ORDER — DARUNAVIR-COBICISTAT 800-150 MG PO TABS
1.0000 | ORAL_TABLET | Freq: Every day | ORAL | 1 refills | Status: AC
  Filled 2023-11-20: qty 30, 30d supply, fill #0

## 2023-11-20 MED ORDER — ORAL CARE MOUTH RINSE: 15.0000 mL | OROMUCOSAL | Status: DC | PRN

## 2023-11-20 MED ORDER — FT TUSSIN DM ADULT 20-200 MG/20ML PO LIQD
5.0000 mL | ORAL | 0 refills | Status: AC | PRN
  Filled 2023-11-20: qty 118, 5d supply, fill #0

## 2023-11-20 MED ORDER — POTASSIUM CHLORIDE 10 MEQ/100ML IV SOLN
10.0000 meq | INTRAVENOUS | Status: AC
  Administered 2023-11-20 (×3): 10 meq via INTRAVENOUS
  Filled 2023-11-20 (×3): qty 100

## 2023-11-20 MED ORDER — BICTEGRAVIR-EMTRICITAB-TENOFOV 50-200-25 MG PO TABS
1.0000 | ORAL_TABLET | Freq: Every day | ORAL | 1 refills | Status: AC
  Filled 2023-11-20: qty 30, 30d supply, fill #0

## 2023-11-20 MED ORDER — POTASSIUM CHLORIDE 10 MEQ/100ML IV SOLN
10.0000 meq | INTRAVENOUS | Status: AC
  Administered 2023-11-20 (×3): 10 meq via INTRAVENOUS
  Filled 2023-11-20: qty 100

## 2023-11-20 MED ORDER — AMOXICILLIN-POT CLAVULANATE 875-125 MG PO TABS
1.0000 | ORAL_TABLET | Freq: Two times a day (BID) | ORAL | 0 refills | Status: DC
  Filled 2023-11-20: qty 10, 5d supply, fill #0

## 2023-11-20 MED ORDER — DAPSONE 100 MG PO TABS
100.0000 mg | ORAL_TABLET | Freq: Every day | ORAL | 0 refills | Status: DC
  Filled 2023-11-20: qty 30, 30d supply, fill #0

## 2023-11-20 NOTE — Plan of Care (Signed)

## 2023-11-20 NOTE — Progress Notes (Addendum)
 Progress Note   Patient: Renee Stark:[id number] DOB: 02-26-1956 DOA: 11/17/2023     3 DOS: the patient was seen and examined on 11/20/2023   Brief hospital course: Renee Stark is a 68 y.o. female with medical history significant of AIDS who is coming to the emergency department with complaints of cough, dyspnea, fatigue and for a week. Admitted to TRH service for pneumonia, ID consulted, started her on HAART meds.   Assessment and Plan: Community acquired pneumonia:. Continue supplemental oxygen to maintain saturation above 92%. Scheduled and as needed bronchodilators. Continue ceftriaxone , azithromycin . Follow sputum cx, PJP smear, fugitell beta d-glucan. Follow-up blood culture and sensitivity.  Hyponatremia Seems chronic. Sodium improved.  Hypokalemia IV potassium replacements ordered. She does not want oral supplements.   Cigarette smoker Tobacco cessation advised. Advised nicotine patch.   AIDS (acquired immune deficiency syndrome) (HCC) HIV 1 RNA 303K. Not on HAART meds at home. ID started her on Biktarvy. She will need to follow ID clinic. Meds to bed ordered for discharge tomorrow.   Hyperlipidemia Not on statin at home.  Severe protein malnutrition: Encourage oral diet, supplements.      Out of bed to chair. Incentive spirometry. Nursing supportive care. Fall, aspiration precautions. Diet:  Diet Orders (From admission, onward)     Start     Ordered   11/18/23 1048  Diet regular Room service appropriate? Yes; Fluid consistency: Thin  Diet effective now       Question Answer Comment  Room service appropriate? Yes   Fluid consistency: Thin      11/18/23 1047           DVT prophylaxis: enoxaparin (LOVENOX) injection 30 mg Start: 11/17/23 2200  Level of care: Telemetry   Code Status: Full Code  Subjective: Patient is seen and examined today morning. Continues to be on 2L supplemental O2. Cough improved, eating fair.  Physical  Exam: Vitals:   11/19/23 0556 11/19/23 2044 11/20/23 0457 11/20/23 1332  BP: 123/70 121/62 (!) 150/73 (!) 147/86  Pulse: 61 76 73 74  Resp: 18 18 18 20   Temp: 97.7 F (36.5 C) 98.4 F (36.9 C) 97.8 F (36.6 C)   TempSrc: Oral Oral Oral   SpO2: 100% 100% 95% 100%  Weight:      Height:        General - Elderly thin built Caucasian female, no apparent distress HEENT - PERRLA, EOMI, atraumatic head, non tender sinuses. Lung - Clear, basal rales,no  rhonchi, wheezes. Heart - S1, S2 heard, no murmurs, rubs, trace pedal edema. Abdomen - Soft, non tender, bowel sounds good Neuro - Alert, awake and oriented x 3, non focal exam. Skin - Warm and dry.  Data Reviewed:      Latest Ref Rng & Units 11/18/2023    5:23 AM 11/17/2023   11:39 AM 09/08/2023   12:53 PM  CBC  WBC 4.0 - 10.5 K/uL 4.8  5.0  6.9   Hemoglobin 12.0 - 15.0 g/dL 81.1  91.4  78.2   Hematocrit 36.0 - 46.0 % 34.9  32.0  35.7   Platelets 150 - 400 K/uL 192  204  237       Latest Ref Rng & Units 11/20/2023    5:32 AM 11/19/2023    5:35 AM 11/18/2023    5:23 AM  BMP  Glucose 70 - 99 mg/dL 956  213  086   BUN 8 - 23 mg/dL 9  14  13    Creatinine 0.44 - 1.00 mg/dL  0.44  0.53  0.41   Sodium 135 - 145 mmol/L 137  137  133   Potassium 3.5 - 5.1 mmol/L 3.0  3.1  3.1   Chloride 98 - 111 mmol/L 97  99  98   CO2 22 - 32 mmol/L 30  27  25    Calcium 8.9 - 10.3 mg/dL 8.6  8.6  8.5    No results found.   Family Communication: Discussed with patient, she understand and agree. All questions answered.  Disposition: Status is: Inpatient Remains inpatient appropriate because: IV antibiotic therapy, O2  Planned Discharge Destination: Home     Time spent: 39 minutes  Author: Aisha Hove, MD 11/20/2023 2:16 PM Secure chat 7am to 7pm For on call review [email or web address].

## 2023-11-20 NOTE — Progress Notes (Signed)
 Initial Nutrition Assessment  DOCUMENTATION CODES:   Severe malnutrition in context of chronic illness, Underweight  INTERVENTION:   -Magic cup TID with meals, each supplement provides 290 kcal and 9 grams of protein   -Pt to request Ensure from unit if desired  -Multivitamin with minerals daily  -Needs updated weight for admission  NUTRITION DIAGNOSIS:   Severe Malnutrition related to chronic illness as evidenced by severe fat depletion, severe muscle depletion.  GOAL:   Patient will meet greater than or equal to 90% of their needs  MONITOR:   PO intake, Supplement acceptance  REASON FOR ASSESSMENT:   Consult Assessment of nutrition requirement/status  ASSESSMENT:   68 y.o. female with medical history significant of AIDS who is coming to the emergency department with complaints of cough, dyspnea, fatigue and for a week. Admitted for pneumonia.  Patient in room, reports feeling hungry today. Frustrated that her breakfast of eggs and potatoes was cold. Denies any issues with swallowing or chewing. Foods taste right. Pt not wanting protein shakes but willing to try Magic cups with meals. Aware that she needs to increase her PO intake. Hopefully lunch will be better. At home ,pt has only been consuming ~2 meals a day. Breakfast consistently with lunch as a sandwich, skips dinner. Pt does not take any daily vitamins.  Per patient, UBW ~90 lbs. States she has not been weighed this admission.   Medications: KCl  Labs reviewed: Low K   NUTRITION - FOCUSED PHYSICAL EXAM:  Flowsheet Row Most Recent Value  Orbital Region Severe depletion  Upper Arm Region Severe depletion  Thoracic and Lumbar Region Severe depletion  Buccal Region Severe depletion  Temple Region Severe depletion  Clavicle Bone Region Severe depletion  Clavicle and Acromion Bone Region Severe depletion  Scapular Bone Region Severe depletion  Dorsal Hand Severe depletion  Patellar Region Severe  depletion  Anterior Thigh Region Severe depletion  Posterior Calf Region Severe depletion  Edema (RD Assessment) None  Hair Reviewed  Eyes Reviewed  Mouth Reviewed  [missing teeth]  Skin Reviewed  Nails Reviewed    Diet Order:   Diet Order             Diet regular Room service appropriate? Yes; Fluid consistency: Thin  Diet effective now                   EDUCATION NEEDS:   Not appropriate for education at this time  Skin:  Skin Assessment: Reviewed RN Assessment  Last BM:  6/19  Height:   Ht Readings from Last 1 Encounters:  11/17/23 5' 2 (1.575 m)    Weight:   Wt Readings from Last 1 Encounters:  11/17/23 40.8 kg    BMI:  Body mass index is 16.46 kg/m.  Estimated Nutritional Needs:   Kcal:  1500-1700  Protein:  75-90g  Fluid:  1.7L/day   Arna Better, MS, RD, LDN Inpatient Clinical Dietitian Contact via Secure chat

## 2023-11-20 NOTE — Progress Notes (Addendum)
 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  She states she has had a productive cough for a while.  Symptoms cough shortness of breath have worked well over the past week.  Will get Fungitell and workup for PJP pneumonia.  At this point we will follow clinically hold off on empirically starting for PJP pneumonia.  Of note her CD4 count 1 from 132 16% on 4/8/ to 49 at 14% during this hospitalization.  I suspect some of the decline in CD4 counts due to acute illness. Plan:  -Continue ctx and metro -Sputum Cx, PJP  smear -Fungitell, ldh, legionella nd strep urine   #HIV /AIDS -CD4 49(16%), bvl 303 k -Pt previously on prezista, tivicay , norvir and viread . Followed by [facility], last seen on 08/02/19. -Pt states she has been off of ART for about 3 years. She states she does not like taking large pill. -Reviewed resistance: RT mutation: M41L, M184V, T215Y, Y188L .PI mutation K20K/R, M36I, D30N. No IH mutation.  Plan: Continue  Biktarvy and prezcobix(can be crushed). Appreciate pharmacy input RPR, Acute hep panel. Negative Fungtiell negative, LDH 206->unlikely PJP PNA -can transition to cefadroxil to complete 7 days of abx for PNA. Completed azithromycin  x 4 days -sputum and pjp smear ordered  -ID appt on 7/10 for hiv care    SUBJECTIVE: Afebrile overnight. Requiring supplemental o2   Review of Systems: Review of Systems  All other systems reviewed and are negative.    Scheduled Meds:   azithromycin   500 mg Oral Daily   bictegravir-emtricitabine-tenofovir AF  1 tablet Oral Daily   darunavir-cobicistat  1 tablet Oral Q breakfast   enoxaparin (LOVENOX) injection  30 mg Subcutaneous QHS   multivitamin with minerals  1 tablet Oral Daily   Continuous Infusions:  cefTRIAXone  (ROCEPHIN )  IV 2 g (11/20/23 1333)   PRN Meds:.acetaminophen **OR** acetaminophen, guaiFENesin-dextromethorphan, ondansetron  **OR** ondansetron  (ZOFRAN ) IV, mouth rinse No Known Allergies  OBJECTIVE: Vitals:   11/19/23 2044 11/20/23 0457 11/20/23 1332 11/20/23 1918  BP: 121/62 (!) 150/73 (!) 147/86 129/73  Pulse: 76 73 74 76  Resp: 18 18 20 16   Temp: 98.4 F (36.9 C) 97.8 F (36.6 C) 98 F (36.7 C) 98.1 F (36.7 C)  TempSrc: Oral Oral  Oral  SpO2: 100% 95% 100% 100%  Weight:      Height:       Body mass index is 16.46 kg/m.  Physical Exam Constitutional:      Appearance: Normal appearance.  HENT:     Head: Normocephalic and atraumatic.     Right Ear: Tympanic membrane normal.     Left Ear: Tympanic membrane normal.     Nose: Nose normal.     Mouth/Throat:     Mouth: Mucous membranes are moist.   Eyes:     Extraocular Movements: Extraocular movements intact.     Conjunctiva/sclera: Conjunctivae normal.     Pupils: Pupils are equal, round, and reactive to light.    Cardiovascular:  Rate and Rhythm: Normal rate and regular rhythm.     Heart sounds: No murmur heard.    No friction rub. No gallop.  Pulmonary:     Effort: Pulmonary effort is normal.     Breath sounds: Normal breath sounds.  Abdominal:     General: Abdomen is flat.     Palpations: Abdomen is soft.   Musculoskeletal:        General: Normal range of motion.   Skin:    General: Skin is warm and dry.   Neurological:     General: No focal deficit present.     Mental Status: She is alert and oriented to person, place, and time.   Psychiatric:        Mood and Affect: Mood normal.       Lab Results Lab  Results  Component Value Date   WBC 4.8 11/18/2023   HGB 11.0 (L) 11/18/2023   HCT 34.9 (L) 11/18/2023   MCV 89.5 11/18/2023   PLT 192 11/18/2023    Lab Results  Component Value Date   CREATININE 0.44 11/20/2023   BUN 9 11/20/2023   NA 137 11/20/2023   K 3.0 (L) 11/20/2023   CL 97 (L) 11/20/2023   CO2 30 11/20/2023    Lab Results  Component Value Date   ALT 15 11/18/2023   AST 34 11/18/2023   ALKPHOS 896 (H) 11/18/2023   BILITOT 0.6 11/18/2023        Orlie Bjornstad, MD [facility] 11/20/2023, 11:43 PM

## 2023-11-20 NOTE — Telephone Encounter (Signed)
 Patient Advocate Encounter  Completed and sent [employer] Advancing Access application for Biktarvy for this patient who is uninsured.      BIN      [id number] PCN    [id number] GRP    [id number] ID        [id number]     Morgan Arab, CPhT Pharmacy Patient Advocate Specialist [facility] Patient Advocate Team Direct Number: [phone] Fax: [phone]

## 2023-11-21 ENCOUNTER — Other Ambulatory Visit (HOSPITAL_COMMUNITY): Payer: Self-pay

## 2023-11-21 DIAGNOSIS — E43 Unspecified severe protein-calorie malnutrition: Secondary | ICD-10-CM

## 2023-11-21 LAB — BASIC METABOLIC PANEL WITH GFR
Anion gap: 10 (ref 5–15)
BUN: 7 mg/dL — ABNORMAL LOW (ref 8–23)
CO2: 28 mmol/L (ref 22–32)
Calcium: 9 mg/dL (ref 8.9–10.3)
Chloride: 98 mmol/L (ref 98–111)
Creatinine, Ser: 0.49 mg/dL (ref 0.44–1.00)
GFR, Estimated: >60 mL/min (ref >60)
Glucose, Bld: 111 mg/dL — ABNORMAL HIGH (ref 70–99)
Potassium: 3.6 mmol/L (ref 3.5–5.1)
Sodium: 136 mmol/L (ref 135–145)

## 2023-11-21 LAB — HEPATITIS A ANTIBODY, TOTAL: hep A Total Ab: NONREACTIVE

## 2023-11-21 MED ORDER — CEFADROXIL 500 MG PO CAPS
500.0000 mg | ORAL_CAPSULE | Freq: Two times a day (BID) | ORAL | 0 refills | Status: AC
  Filled 2023-11-21: qty 14, 7d supply, fill #0

## 2023-11-21 NOTE — Progress Notes (Signed)
 Pt O2 sat was 87% while ambulating on RA.

## 2023-11-21 NOTE — Progress Notes (Signed)
 Discharge medication delivered to patient at bedside [facility]

## 2023-11-21 NOTE — Plan of Care (Signed)

## 2023-11-21 NOTE — Discharge Summary (Signed)
 Physician Discharge Summary   Patient: Renee Stark MRN: [id number] DOB: April 19, 1956  Admit date:     11/17/2023  Discharge date: 11/21/23  Discharge Physician: Concepcion Riser   PCP: Patient, No Pcp Per   Recommendations at discharge:  {Tip this will not be part of the note when signed- Example include specific recommendations for outpatient follow-up, pending tests to follow-up on. (Optional):26781}  ***  Discharge Diagnoses: Principal Problem:   CAP (community acquired pneumonia) Active Problems:   Cigarette smoker   AIDS (acquired immune deficiency syndrome) (HCC)   Hyperlipidemia   Hypokalemia   Hyponatremia   Protein-calorie malnutrition, severe  Resolved Problems:   * No resolved hospital problems. [facility] Course: No notes on file  Assessment and Plan: No notes have been filed under this hospital service. Service: Hospitalist     {Tip this will not be part of the note when signed Body mass index is 16.08 kg/m. ,  Nutrition Documentation    Flowsheet Row ED to Hosp-Admission (Current) from 11/17/2023 in [facility]  Nutrition Problem Severe Malnutrition  Etiology chronic illness  Nutrition Goal Patient will meet greater than or equal to 90% of their needs  Interventions Magic cup, MVI  ,  (Optional):26781}  {(NOTE) Pain control PDMP Statment (Optional):26782} Consultants: *** Procedures performed: ***  Disposition: {Plan; Disposition:26390} Diet recommendation:  Discharge Diet Orders (From admission, onward)     Start     Ordered   11/21/23 0000  Diet general        11/21/23 1410           {Diet_Plan:26776} DISCHARGE MEDICATION: Allergies as of 11/21/2023   No Known Allergies      Medication List     TAKE these medications    bictegravir-emtricitabine -tenofovir  AF 50-200-25 MG Tabs tablet Commonly known as: BIKTARVY  Take 1 tablet by mouth daily.   cefadroxil  500 MG capsule Commonly  known as: DURICEF Take 1 capsule (500 mg total) by mouth 2 (two) times daily for 7 days.   darunavir -cobicistat  800-150 MG tablet Commonly known as: PREZCOBIX  Take 1 tablet by mouth daily with breakfast. Swallow whole. Do NOT crush, break or chew tablets. Take with food.   FT Tussin DM Adult 20-200 MG/20ML Liqd Generic drug: Dextromethorphan -guaiFENesin  Take 5 mLs by mouth every 4 (four) hours as needed (chest congestion).   multivitamin with minerals Tabs tablet Take 1 tablet by mouth daily.        Follow-up Information     [facility]. Go on 01/29/2024.   Specialty: Internal Medicine Why: You have an appointment to establish primary care services on Friday, 01/29/2024 at 9:20am. Please arrive 15 minutes early to complete new patient paperwork.If a cancellation is unavoidable, please cancel your visit at least 24 hours in advance so that we may give this time to another patient. Please remember to bring the following to your appointment: Current Insurance Card(s) All medications & a list of medications Driver's License or Valid ID Contact information: [address] [place]  [address] [phone] Additional information: [address]  [address]  [place], [place] [address]        [name], MD. Go to.   Specialty: Infectious Diseases Why: 12/10/23 Contact information: [address] [place] [address] [phone]                Discharge Exam: Renee Stark   11/17/23 1321 11/21/23 0500  Weight:  40.8 kg 39.9 kg   ***  Condition at discharge: {DC Condition:26389}  The results of significant diagnostics from this hospitalization (including imaging, microbiology, ancillary and laboratory) are listed below for reference.   Imaging Studies: CT Angio Chest PE W and/or Wo Contrast Result Date: 11/17/2023 CLINICAL DATA:  Pulmonary embolism EXAM: CT ANGIOGRAPHY CHEST WITH  CONTRAST TECHNIQUE: Multidetector CT imaging of the chest was performed using the standard protocol during bolus administration of intravenous contrast. Multiplanar CT image reconstructions and MIPs were obtained to evaluate the vascular anatomy. Multiplanar image (3D post-processing) reconstructions and MIPs were obtained to evaluate the vascular anatomy. RADIATION DOSE REDUCTION: This exam was performed according to the departmental dose-optimization program which includes automated exposure control, adjustment of the mA and/or kV according to patient size and/or use of iterative reconstruction technique. CONTRAST:  75mL OMNIPAQUE  IOHEXOL  350 MG/ML SOLN COMPARISON:  None Available. FINDINGS: Cardiovascular: Normal size heart. No pericardial effusion. Calcific atherosclerosis is present in the left and right coronary territories. There is no evidence of pulmonary embolism the segmental level. Main pulmonary artery is within normal limits for size. Three vessel aortic arch. The descending thoracic aorta is patent. Mediastinum/Nodes: Large hiatal hernia which contains stomach and colon. Lungs/Pleura: Multifocal airspace consolidation is present which is worst in the left lower lobe but patchy opacities are present as well in the right lower lobe, right upper lobe, left upper lobe, right middle lobe and lingula. No pleural effusion or pneumothorax. Upper Abdomen: No acute abnormality. Musculoskeletal: No chest wall abnormality. No acute or significant osseous findings. Review of the MIP images confirms the above findings. IMPRESSION: 1. No evidence of pulmonary embolism to the segmental level. 2. Multifocal airspace disease compatible with pneumonia which is worst in the left lower lobe. 3. Coronary artery atherosclerotic vascular disease. Electronically Signed   By: Maude Naegeli M.D.   On: 11/17/2023 15:03   DG Chest 2 View Result Date: 11/17/2023 CLINICAL DATA:  Shortness of breath EXAM: CHEST - 2 VIEW  COMPARISON:  X-ray 09/08/2023 and older FINDINGS: Hyperinflation. Persistent opacity at the left lung base with a left-sided pleural effusion. Chronic lung changes elsewhere. No pneumothorax. Stable cardiopericardial silhouette. Calcified aorta. Frontal view is rotated to left. Osteopenia. Overlapping artifacts from clothing and patient's necklace. Known hiatal hernia. IMPRESSION: Persistent left-sided pleural effusion and opacity. Recommend continued follow-up. Electronically Signed   By: Ranell Bring M.D.   On: 11/17/2023 12:36    Microbiology: Results for orders placed or performed during the hospital encounter of 11/17/23  Resp panel by RT-PCR (RSV, Flu A&B, Covid) Anterior Nasal Swab     Status: None   Collection Time: 11/17/23 10:44 AM   Specimen: Anterior Nasal Swab  Result Value Ref Range Status   SARS Coronavirus 2 by RT PCR NEGATIVE NEGATIVE Final    Comment: (NOTE) SARS-CoV-2 target nucleic acids are NOT DETECTED.  The SARS-CoV-2 RNA is generally detectable in upper respiratory specimens during the acute phase of infection. The lowest concentration of SARS-CoV-2 viral copies this assay can detect is 138 copies/mL. A negative result does not preclude SARS-Cov-2 infection and should not be used as the sole basis for treatment or other patient management decisions. A negative result may occur with  improper specimen collection/handling, submission of specimen other than nasopharyngeal swab, presence of viral mutation(s) within the areas targeted by this assay, and inadequate number of viral copies(<138 copies/mL). A negative result must be combined with clinical observations, patient history, and epidemiological information. The expected result is Negative.  Fact Sheet for Patients:  BloggerCourse.com  Fact Sheet for Healthcare Providers:  SeriousBroker.it  This test is no t yet approved or cleared by the [place]  FDA and   has been authorized for detection and/or diagnosis of SARS-CoV-2 by FDA under an Emergency Use Authorization (EUA). This EUA will remain  in effect (meaning this test can be used) for the duration of the COVID-19 declaration under Section 564(b)(1) of the Act, 21 U.S.C.section 360bbb-3(b)(1), unless the authorization is terminated  or revoked sooner.       Influenza A by PCR NEGATIVE NEGATIVE Final   Influenza B by PCR NEGATIVE NEGATIVE Final    Comment: (NOTE) The Xpert Xpress SARS-CoV-2/FLU/RSV plus assay is intended as an aid in the diagnosis of influenza from Nasopharyngeal swab specimens and should not be used as a sole basis for treatment. Nasal washings and aspirates are unacceptable for Xpert Xpress SARS-CoV-2/FLU/RSV testing.  Fact Sheet for Patients: BloggerCourse.com  Fact Sheet for Healthcare Providers: SeriousBroker.it  This test is not yet approved or cleared by the [place]  FDA and has been authorized for detection and/or diagnosis of SARS-CoV-2 by FDA under an Emergency Use Authorization (EUA). This EUA will remain in effect (meaning this test can be used) for the duration of the COVID-19 declaration under Section 564(b)(1) of the Act, 21 U.S.C. section 360bbb-3(b)(1), unless the authorization is terminated or revoked.     Resp Syncytial Virus by PCR NEGATIVE NEGATIVE Final    Comment: (NOTE) Fact Sheet for Patients: BloggerCourse.com  Fact Sheet for Healthcare Providers: SeriousBroker.it  This test is not yet approved or cleared by the [place]  FDA and has been authorized for detection and/or diagnosis of SARS-CoV-2 by FDA under an Emergency Use Authorization (EUA). This EUA will remain in effect (meaning this test can be used) for the duration of the COVID-19 declaration under Section 564(b)(1) of the Act, 21 U.S.C. section 360bbb-3(b)(1),  unless the authorization is terminated or revoked.  Performed at [facility], [address], [place], [place] [address]   Culture, blood (routine x 2)     Status: None (Preliminary result)   Collection Time: 11/17/23 11:39 AM   Specimen: BLOOD  Result Value Ref Range Status   Specimen Description   Final    BLOOD LEFT ANTECUBITAL Performed at [facility], [address], [place], [place] [address]    Special Requests   Final    BOTTLES DRAWN AEROBIC AND ANAEROBIC Blood Culture adequate volume Performed at [facility], [address], [place], [place] [address]    Culture   Final    NO GROWTH 4 DAYS Performed at [facility], [address], [place], [place] [address]    Report Status PENDING  Incomplete  Culture, blood (routine x 2)     Status: None (Preliminary result)   Collection Time: 11/17/23 12:20 PM   Specimen: BLOOD  Result Value Ref Range Status   Specimen Description   Final    BLOOD SITE NOT SPECIFIED Performed at [facility], [address], [place], [place] [address]    Special Requests   Final    BOTTLES DRAWN AEROBIC AND ANAEROBIC Blood Culture results may not be optimal due to an inadequate volume of blood received in culture bottles Performed at [facility], [address] [place], [place] [address]    Culture   Final    NO GROWTH 4 DAYS Performed at [facility],  [address], [place], [place] [address]    Report Status PENDING  Incomplete    Labs: CBC: Recent Labs  Lab 11/17/23 1139 11/18/23 0523  WBC 5.0 4.8  HGB 10.4* 11.0*  HCT 32.0* 34.9*  MCV 87.0 89.5  PLT 204 192   Basic Metabolic Panel: Recent Labs  Lab 11/17/23 1139 11/17/23 1348 11/18/23 0523 11/19/23 0535 11/20/23 0532 11/21/23 0615  NA 132*  --  133* 137 137 136  K 2.9*  --  3.1* 3.1* 3.0* 3.6  CL 94*  --  98 99 97* 98  CO2 26  --  25 27 30 28   GLUCOSE  125*  --  157* 128* 104* 111*  BUN 15  --  13 14 9  7*  CREATININE 0.55  --  0.41* 0.53 0.44 0.49  CALCIUM 8.8*  --  8.5* 8.6* 8.6* 9.0  MG  --  1.9  --   --   --   --    Liver Function Tests: Recent Labs  Lab 11/18/23 0523  AST 34  ALT 15  ALKPHOS 896*  BILITOT 0.6  PROT 7.2  ALBUMIN 2.5*   CBG: No results for input(s): GLUCAP in the last 168 hours.  Discharge time spent: {LESS THAN/GREATER UYJW:73611} 30 minutes.  Signed: Concepcion Riser, MD [facility] 11/21/2023

## 2023-11-21 NOTE — Progress Notes (Addendum)
 Mobility Specialist - Progress Note   11/21/23 1009  Oxygen Therapy  SpO2 92 %  O2 Device Nasal Cannula  O2 Flow Rate (L/min) 1 L/min  Patient Activity (if Appropriate) Ambulating  Mobility  Activity Ambulated with assistance in hallway  Level of Assistance Modified independent, requires aide device or extra time  Assistive Device Other (Comment) (IV Pole)  Distance Ambulated (ft) 275 ft  Activity Response Tolerated well  Mobility Referral Yes  Mobility visit 1 Mobility  Mobility Specialist Start Time (ACUTE ONLY) [id number]  Mobility Specialist Stop Time (ACUTE ONLY) 1008  Mobility Specialist Time Calculation (min) (ACUTE ONLY) 14 min   Pt received in bed and agreeable to mobility. No complaints during session. Pt to bed after session with all needs met.   Pre-mobility: 100% SpO2 (1L [place]) During mobility: 92% SpO2 (1L [place]) Post-mobility: 97% SPO2 (1L [place])  Chief Technology Officer

## 2023-11-22 LAB — CULTURE, BLOOD (ROUTINE X 2)
Culture: NO GROWTH
Culture: NO GROWTH
Special Requests: ADEQUATE

## 2023-11-23 ENCOUNTER — Other Ambulatory Visit (HOSPITAL_COMMUNITY): Payer: Self-pay

## 2023-11-24 ENCOUNTER — Telehealth: Payer: Self-pay

## 2023-11-24 LAB — HEPATITIS B SURFACE ANTIBODY, QUANTITATIVE: Hep B S AB Quant (Post): <3.5 m[IU]/mL — ABNORMAL LOW

## 2023-11-24 LAB — HEPATITIS B CORE ANTIBODY, TOTAL: HEP B CORE AB: NEGATIVE

## 2023-11-24 NOTE — Telephone Encounter (Signed)
.  [facility] Patient Advocate Encounter  Completed and sent [facility] Patient Assistance application for Prezcobix  for this patient who is uninsured.    Patient is approved 11/23/23 through 11/22/24.  Medication will be shipped to Treatment Center at [facility].   If patient do not receive medication , patient would need to contact [facility] @ [phone] mon-fri 8am-8pm   Arland Hutchinson, CPhT Specialty Pharmacy Patient [facility] Phone: [phone] Fax:  [phone]

## 2023-12-10 ENCOUNTER — Inpatient Hospital Stay: Payer: Self-pay | Admitting: Internal Medicine

## 2023-12-15 ENCOUNTER — Other Ambulatory Visit (HOSPITAL_COMMUNITY): Payer: Self-pay

## 2023-12-15 ENCOUNTER — Telehealth: Payer: Self-pay

## 2023-12-15 NOTE — Telephone Encounter (Signed)
 Pharmacy Patient Advocate Encounter  Insurance verification completed.   The patient is insured through [employer]   Ran test claim for [employer]. Currently a quantity of 30 is a 30 day supply and the co-pay is 0.00 .   This test claim was processed through [facility]- copay amounts may vary at other pharmacies due to pharmacy/plan contracts, or as the patient moves through the different stages of their insurance plan.

## 2024-01-29 ENCOUNTER — Ambulatory Visit: Payer: Self-pay | Admitting: Family Medicine

## 2024-04-26 ENCOUNTER — Emergency Department (HOSPITAL_COMMUNITY)
Admission: EM | Admit: 2024-04-26 | Discharge: 2024-04-26 | Disposition: A | Discharge status: Home / Self Care | Attending: Emergency Medicine | Admitting: Emergency Medicine

## 2024-04-26 ENCOUNTER — Other Ambulatory Visit: Payer: Self-pay

## 2024-04-26 DIAGNOSIS — H5789 Other specified disorders of eye and adnexa: Secondary | ICD-10-CM | POA: Diagnosis present

## 2024-04-26 DIAGNOSIS — J309 Allergic rhinitis, unspecified: Secondary | ICD-10-CM | POA: Insufficient documentation

## 2024-04-26 DIAGNOSIS — H1011 Acute atopic conjunctivitis, right eye: Secondary | ICD-10-CM | POA: Insufficient documentation

## 2024-04-26 MED ORDER — OLOPATADINE HCL 0.1 % OP SOLN: 1.0000 [drp] | Freq: Two times a day (BID) | OPHTHALMIC | 12 refills | Status: AC

## 2024-04-26 MED ORDER — CETIRIZINE HCL 10 MG PO TABS: 10.0000 mg | ORAL_TABLET | Freq: Every day | ORAL | 1 refills | Status: AC

## 2024-04-26 NOTE — ED Triage Notes (Signed)
 PT ambulatory to triage with complaints of RIGHT eye pain and redness for over 1 week. PT also has complaints of nasal congestion. Pt appears to be in no distress during triage.

## 2024-04-26 NOTE — Discharge Instructions (Signed)
 As discussed, use the prescribed eyedrops for the allergic conjunctivitis in the right eye, and you can use a once daily cetirizine  to help manage allergic symptoms.  These do take about 1 to 2 weeks to reach maximum effect, continue to take this consistently for the greatest benefit.  If you do start to have thick greenish discharge from the eye, or worsening pain and tenderness to the eye, come back for reevaluation as this may signal a bacterial infection started to take root

## 2024-04-26 NOTE — ED Provider Notes (Signed)
 Renee Stark Provider Note   CSN: [id number] Arrival date & time: 04/26/24  9173     Patient presents with: Conjunctivitis and Nasal Congestion   Renee Stark is a 68 y.o. female who presents ED today with concerns of right sided eye redness and irritation, has been present over the last week.  Over the last 2 days she has had increasing clear nasal drainage, does not endorse any sinus pain or pressure, does not have any purulent drainage from the eye, and does not have any pain or tenderness over the eye or the sinuses.    Conjunctivitis       Prior to Admission medications   Medication Sig Start Date End Date Taking? Authorizing Provider  cetirizine  (ZYRTEC ) 10 MG tablet Take 1 tablet (10 mg total) by mouth daily. 04/26/24  Yes Myriam Dorn BROCKS, PA  olopatadine  (PATANOL) 0.1 % ophthalmic solution Place 1 drop into the right eye 2 (two) times daily. 04/26/24  Yes Myriam Dorn BROCKS, PA  bictegravir-emtricitabine -tenofovir  AF (BIKTARVY ) 50-200-25 MG TABS tablet Take 1 tablet by mouth daily. 11/21/23   Darci Pore, MD  darunavir -cobicistat  (PREZCOBIX ) 800-150 MG tablet Take 1 tablet by mouth daily with breakfast. Swallow whole. Do NOT crush, break or chew tablets. Take with food. 11/21/23   Darci Pore, MD  Dextromethorphan -guaiFENesin  (FT TUSSIN DM ADULT) 20-200 MG/20ML LIQD Take 5 mLs by mouth every 4 (four) hours as needed (chest congestion). 11/20/23   Darci Pore, MD  Multiple Vitamin (MULTIVITAMIN WITH MINERALS) TABS tablet Take 1 tablet by mouth daily. 11/21/23   Darci Pore, MD    Allergies: Patient has no known allergies.    Review of Systems  HENT:  Positive for congestion.   Eyes:  Positive for redness and itching.  All other systems reviewed and are negative.   Updated Vital Signs BP (!) 171/85   Pulse 90   Temp 97.9 F (36.6 C) (Oral)   Resp 18   SpO2 95%   Physical  Exam Vitals and nursing note reviewed.  Constitutional:      General: She is not in acute distress.    Appearance: She is well-developed.  HENT:     Head: Normocephalic and atraumatic.     Nose:     Comments: Clear rhinorrhea appreciated.    Mouth/Throat:     Comments: Posterior oropharynx is unremarkable. Eyes:     General: Lids are normal. Vision grossly intact. Gaze aligned appropriately.     Conjunctiva/sclera: Conjunctivae normal.     Right eye: Chemosis present.     Comments: There is mild chemosis of the right eye without any noted conjunctival injection.  No exudate appreciated from the right eye.  Left eye appears normal.  Cardiovascular:     Rate and Rhythm: Normal rate and regular rhythm.     Heart sounds: No murmur heard. Pulmonary:     Effort: Pulmonary effort is normal. No respiratory distress.     Breath sounds: Normal breath sounds.  Abdominal:     Palpations: Abdomen is soft.     Tenderness: There is no abdominal tenderness.  Musculoskeletal:        General: No swelling.     Cervical back: Neck supple.  Skin:    General: Skin is warm and dry.     Capillary Refill: Capillary refill takes less than 2 seconds.  Neurological:     Mental Status: She is alert.  Psychiatric:  Mood and Affect: Mood normal.     (all labs ordered are listed, but only abnormal results are displayed) Labs Reviewed - No data to display  EKG: None  Radiology: No results found.   Procedures   Medications Ordered in the ED - No data to display                                  Medical Decision Making Risk OTC drugs.   Given the physical exam findings along with a history of present illness, this seems consistent with allergic conjunctivitis of the right eye as well as allergic rhinitis.  Symptoms have been present over the last week, with some rhinorrhea that is initiated over the last 2 to 3 days, she Dors is increased lacrimation but denies having any purulent  discharge from the eye and denies have any pain or tenderness over the eye and over the sinuses bilaterally.  Given these reassuring findings this supports a diagnosis of allergic rhinitis as well as allergic conjunctivitis.  Will manage this with a course of olopatadine  drops to be used in the right eye along with initiation of oral cetirizine  once daily.  Encouraged follow-up with primary care for continued management of her symptoms, careful return precautions have been given, patient understands agrees has no further concerns at this time.  As the patient is stable, has no concerning findings on physical exam, will discharge patient with outpatient management as previously discussed.     Final diagnoses:  Allergic conjunctivitis of right eye  Allergic rhinitis, unspecified seasonality, unspecified trigger    ED Discharge Orders          Ordered    olopatadine  (PATANOL) 0.1 % ophthalmic solution  2 times daily        04/26/24 0841    cetirizine  (ZYRTEC ) 10 MG tablet  Daily        04/26/24 0841               Myriam Dorn BROCKS, PA 04/26/24 0846    Ruthe Cornet, DO 04/26/24 [phone]
